# Patient Record
Sex: Male | Born: 1955 | Race: White | Hispanic: No | Smoking: Never smoker
Health system: Southern US, Community
[De-identification: ages and names within clinical notes are randomized; demographics above are authoritative.]

## PROBLEM LIST (undated history)

## (undated) DIAGNOSIS — M1990 Unspecified osteoarthritis, unspecified site: Secondary | ICD-10-CM

## (undated) DIAGNOSIS — I10 Essential (primary) hypertension: Secondary | ICD-10-CM

## (undated) DIAGNOSIS — F419 Anxiety disorder, unspecified: Secondary | ICD-10-CM

## (undated) DIAGNOSIS — G4730 Sleep apnea, unspecified: Secondary | ICD-10-CM

## (undated) DIAGNOSIS — Z8709 Personal history of other diseases of the respiratory system: Secondary | ICD-10-CM

## (undated) DIAGNOSIS — M109 Gout, unspecified: Secondary | ICD-10-CM

## (undated) DIAGNOSIS — F988 Other specified behavioral and emotional disorders with onset usually occurring in childhood and adolescence: Secondary | ICD-10-CM

## (undated) HISTORY — PX: FRACTURE SURGERY: SHX138

## (undated) HISTORY — PX: JOINT REPLACEMENT: SHX530

## (undated) HISTORY — DX: Gout, unspecified: M10.9

## (undated) HISTORY — DX: Anxiety disorder, unspecified: F41.9

## (undated) HISTORY — PX: COLONOSCOPY: SHX174

## (undated) HISTORY — PX: KNEE ARTHROSCOPY: SUR90

## (undated) HISTORY — PX: ORIF ANKLE FRACTURE: SUR919

---

## 2004-06-18 ENCOUNTER — Emergency Department (HOSPITAL_COMMUNITY): Admission: EM | Admit: 2004-06-18 | Discharge: 2004-06-18 | Payer: Self-pay | Admitting: Emergency Medicine

## 2005-05-19 ENCOUNTER — Ambulatory Visit (HOSPITAL_BASED_OUTPATIENT_CLINIC_OR_DEPARTMENT_OTHER): Admission: RE | Admit: 2005-05-19 | Discharge: 2005-05-19 | Payer: Self-pay | Admitting: Orthopedic Surgery

## 2005-07-14 ENCOUNTER — Inpatient Hospital Stay (HOSPITAL_COMMUNITY): Admission: EM | Admit: 2005-07-14 | Discharge: 2005-07-15 | Payer: Self-pay | Admitting: Emergency Medicine

## 2006-12-26 IMAGING — US US RENAL
1 series · 14 of 21 positions shown · non-contrast
Comparison: none

CLINICAL DATA: Hypotension.  No previous surgery. 
 RENAL/URINARY TRACT ULTRASOUND:
TECHNIQUE: Complete ultrasound examination of the urinary tract was performed including evaluation of the kidneys, renal collecting systems, and urinary bladder.

[Series 1: unknown · 0.35mm/px · 14 of 21 slices shown]
[im 1/21]
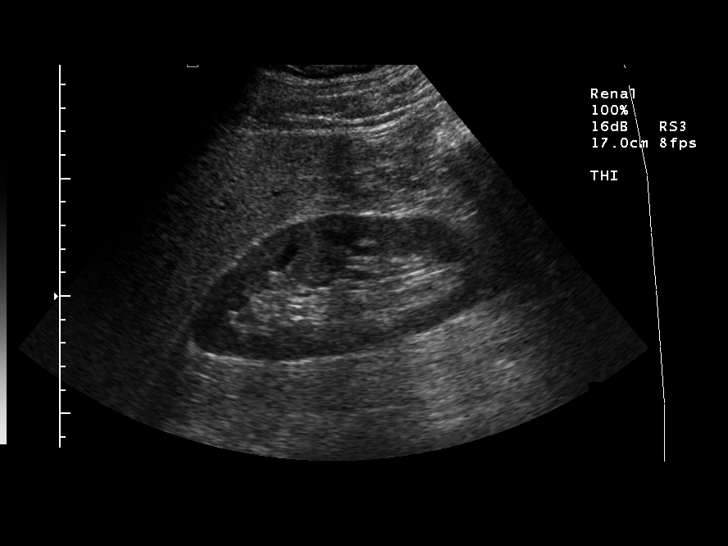
[im 3/21]
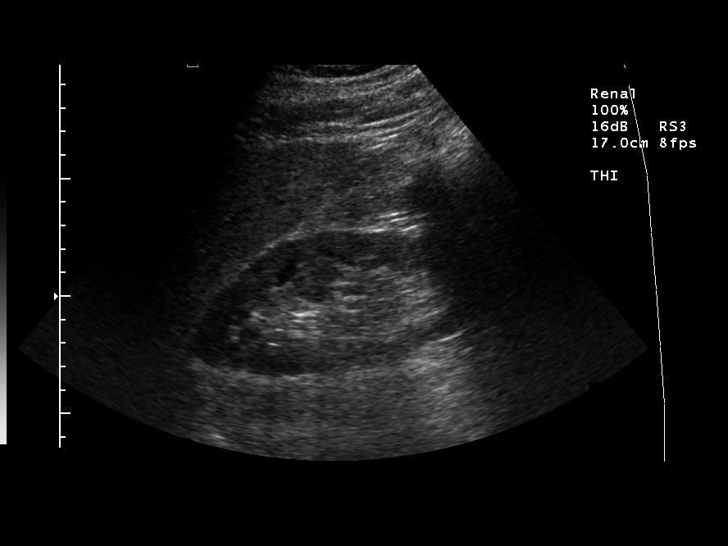
[im 4/21]
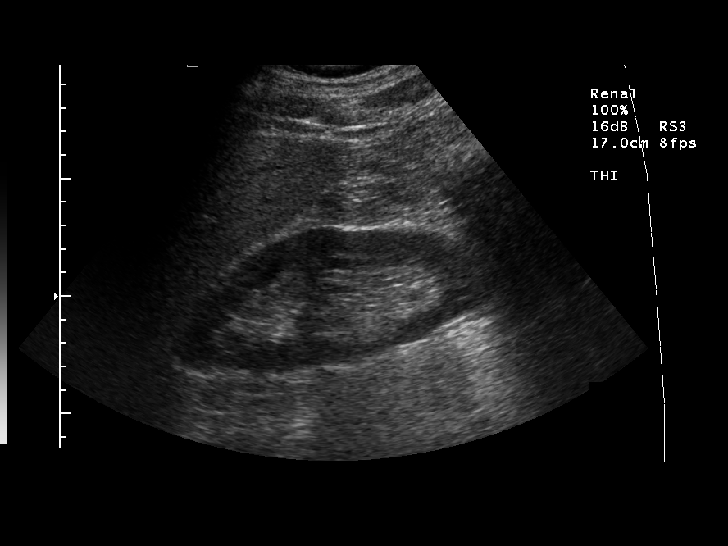
[im 6/21]
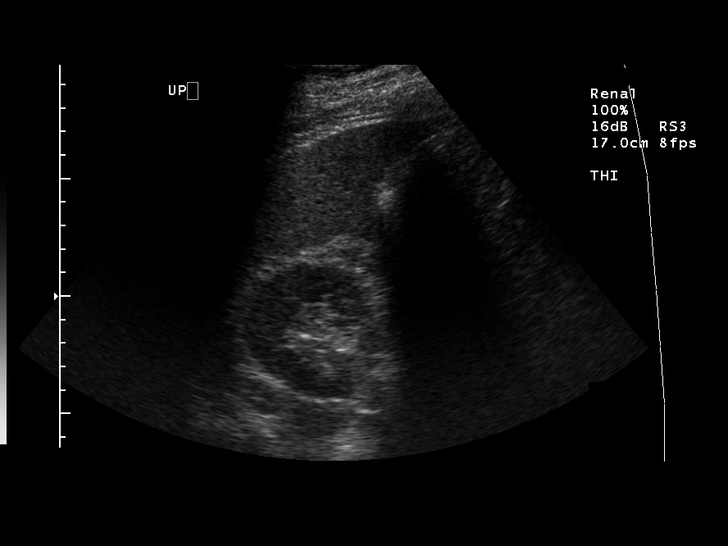
[im 7/21]
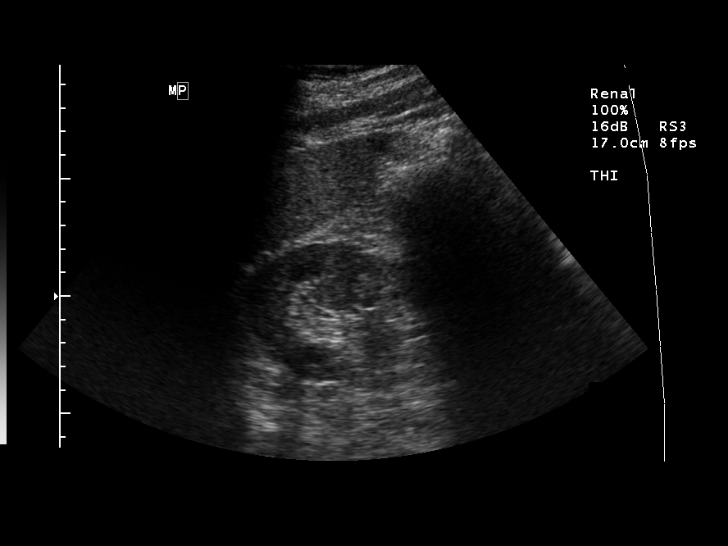
[im 9/21]
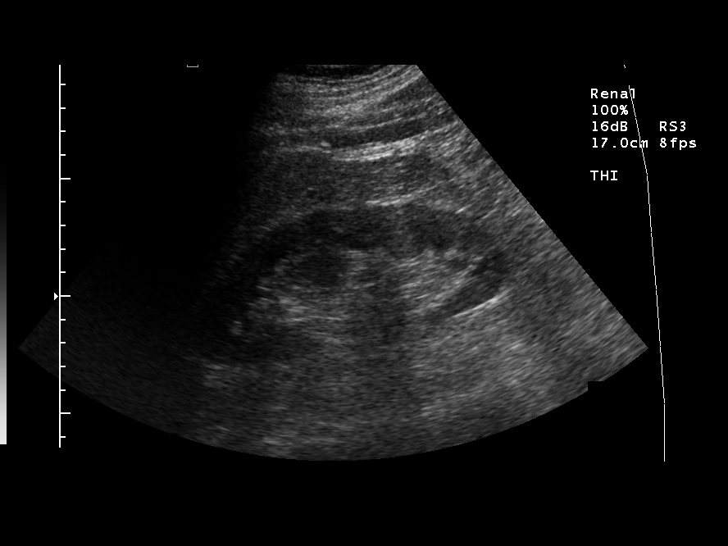
[im 10/21]
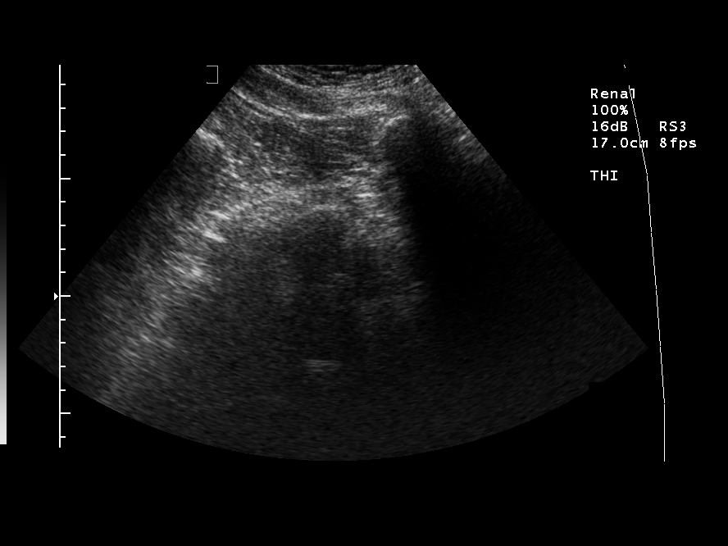
[im 12/21]
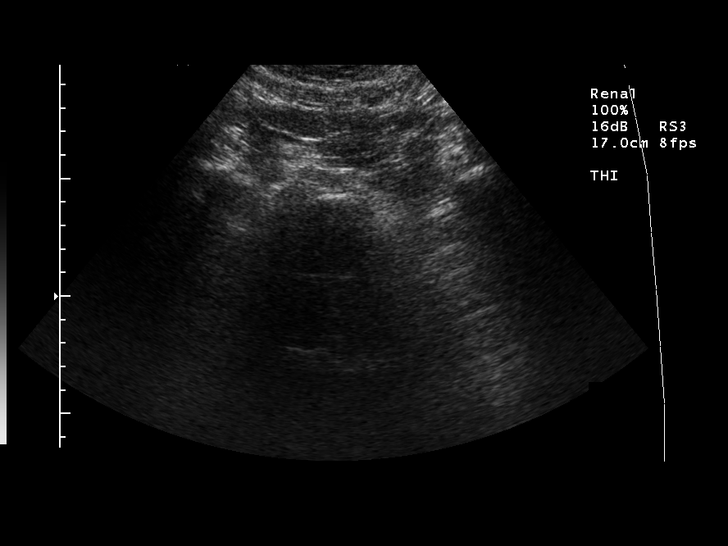
[im 13/21]
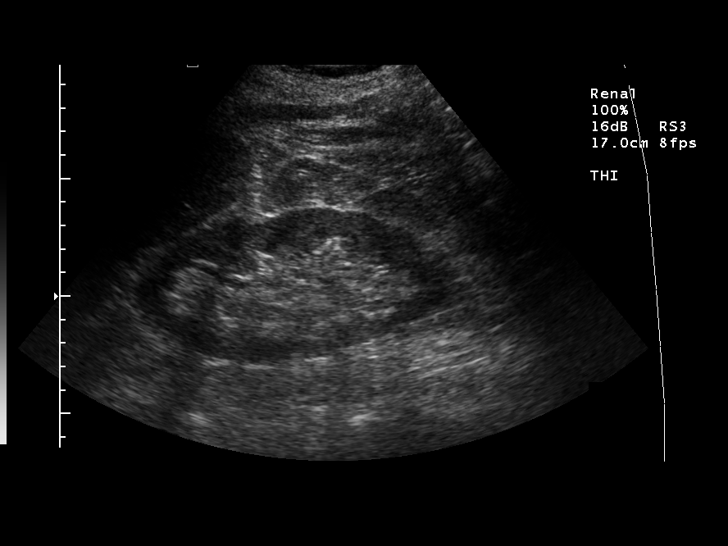
[im 15/21]
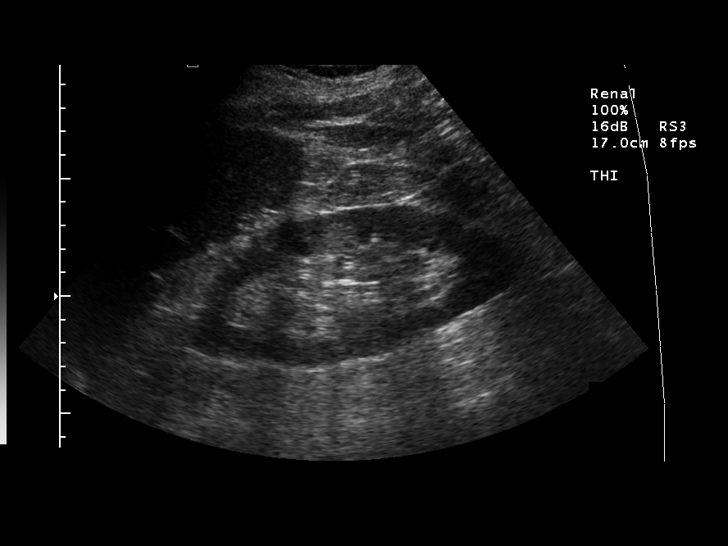
[im 16/21]
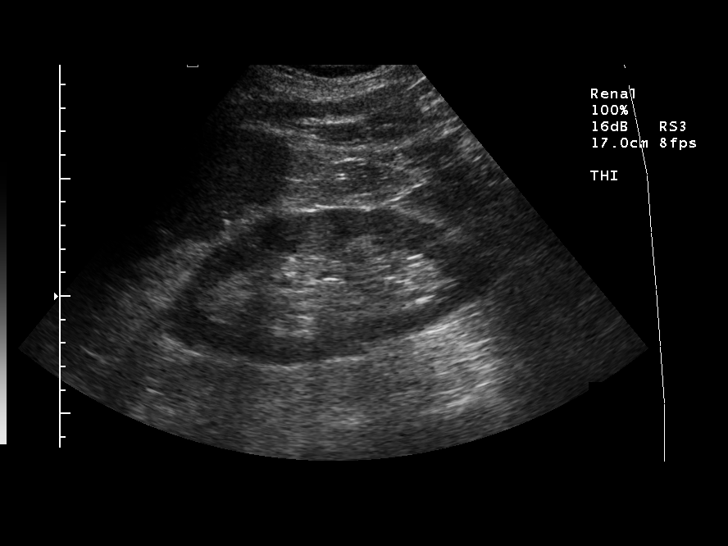
[im 18/21]
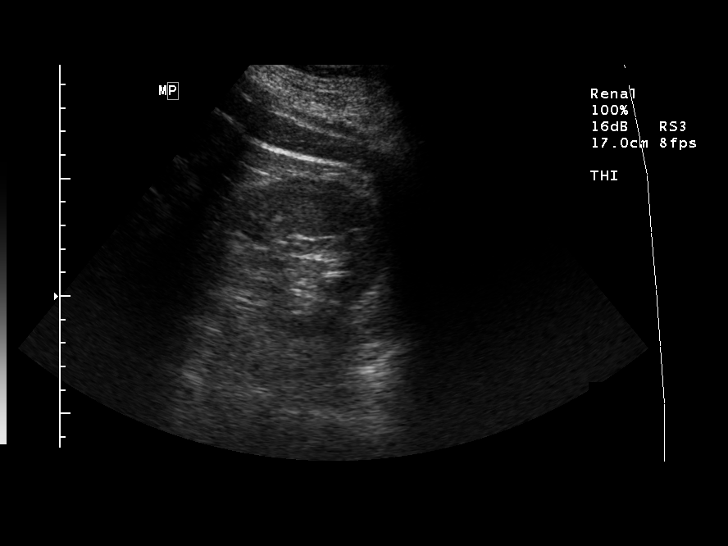
[im 19/21]
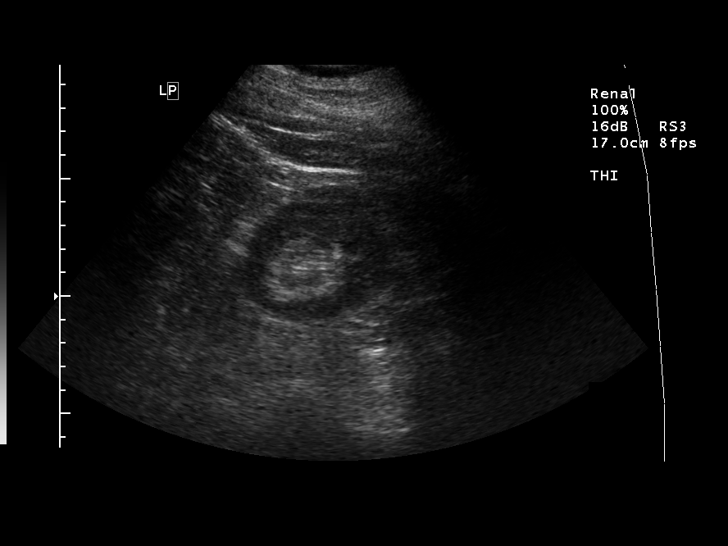
[im 21/21]
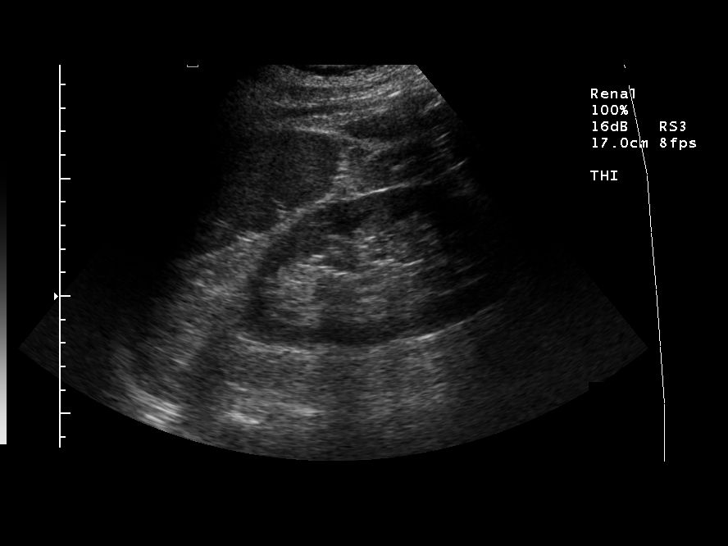

[14 of 21 positions shown; findings below may reference images not displayed]

FINDINGS: Multiple scans of the right kidney show the right kidney to measure 14.2 cm in length with no evidence of hydronephrosis, mass, or calcification. 
 The left kidney measures 13.7 cm and shows no evidence of hydronephrosis, mass, or calcification. 
 There is a Foley catheter in the bladder.
IMPRESSION: Normal right and left kidneys.  Foley catheter in the bladder.

## 2016-02-20 ENCOUNTER — Other Ambulatory Visit: Payer: Self-pay | Admitting: Orthopaedic Surgery

## 2016-03-02 ENCOUNTER — Inpatient Hospital Stay (HOSPITAL_COMMUNITY): Admission: RE | Admit: 2016-03-02 | Payer: Self-pay | Source: Ambulatory Visit

## 2016-03-10 ENCOUNTER — Encounter (HOSPITAL_COMMUNITY): Admission: RE | Payer: Self-pay | Source: Ambulatory Visit

## 2016-03-10 ENCOUNTER — Inpatient Hospital Stay (HOSPITAL_COMMUNITY): Admission: RE | Admit: 2016-03-10 | Payer: Self-pay | Source: Ambulatory Visit | Admitting: Orthopaedic Surgery

## 2016-03-10 SURGERY — ARTHROPLASTY, KNEE, TOTAL
Anesthesia: Spinal | Laterality: Right

## 2016-04-23 ENCOUNTER — Encounter: Payer: Self-pay | Admitting: Family Medicine

## 2016-04-28 ENCOUNTER — Other Ambulatory Visit: Payer: Self-pay | Admitting: Orthopaedic Surgery

## 2016-05-08 NOTE — Pre-Procedure Instructions (Signed)
    Jeremy Bennett  05/08/2016      [id number] [place], [place] [address] [place] [address] Phone: [phone] Fax: [phone]    Your procedure is scheduled on Tuesday, April 24th   Report to [facility] Admitting at 5:30 AM             (posted surgery time is 7:30 am - 9:47 am) .  Call this number if you have questions regarding surgery:  [phone], Mon - Fri from 8-4:30 pm. If unable to come, call [phone] that morning.     Remember:  Do not eat food or drink liquids after midnight Monday.              Take these medicines the MORNING of surgery : NOTHING               4-5 days prior to surgery, STOP taking any VITAMINS, HERBAL SUPPLEMENTS, ANTI-INFLAMMATORIES.   Do not wear jewelry - NO rings or watches.  Do not wear lotions, colognes or deoderant.             Men may shave face and neck.   Do not bring valuables to the hospital.  [facility] is not responsible for any belongings or valuables.  Contacts, dentures or bridgework may not be worn into surgery.  Leave your suitcase in the car.  After surgery it may be brought to your room.  For patients admitted to the hospital, discharge time will be determined by your treatment team.  Please read over the following fact sheets that you were given. Pain Booklet, MRSA Information and Surgical Site Infection Prevention

## 2016-05-11 ENCOUNTER — Other Ambulatory Visit: Payer: Self-pay

## 2016-05-11 ENCOUNTER — Encounter (HOSPITAL_COMMUNITY)
Admission: RE | Admit: 2016-05-11 | Discharge: 2016-05-11 | Disposition: A | Discharge status: Home / Self Care | Payer: 59 | Source: Ambulatory Visit | Attending: Orthopaedic Surgery | Admitting: Orthopaedic Surgery

## 2016-05-11 ENCOUNTER — Encounter (HOSPITAL_COMMUNITY): Payer: Self-pay

## 2016-05-11 ENCOUNTER — Encounter (HOSPITAL_COMMUNITY): Admission: RE | Admit: 2016-05-11 | Payer: 59 | Source: Ambulatory Visit

## 2016-05-11 DIAGNOSIS — Z01812 Encounter for preprocedural laboratory examination: Secondary | ICD-10-CM | POA: Insufficient documentation

## 2016-05-11 DIAGNOSIS — Z01810 Encounter for preprocedural cardiovascular examination: Secondary | ICD-10-CM | POA: Diagnosis not present

## 2016-05-11 DIAGNOSIS — Z01818 Encounter for other preprocedural examination: Secondary | ICD-10-CM

## 2016-05-11 HISTORY — DX: Sleep apnea, unspecified: G47.30

## 2016-05-11 HISTORY — DX: Essential (primary) hypertension: I10

## 2016-05-11 HISTORY — DX: Gout, unspecified: M10.9

## 2016-05-11 HISTORY — DX: Unspecified osteoarthritis, unspecified site: M19.90

## 2016-05-11 HISTORY — DX: Other specified behavioral and emotional disorders with onset usually occurring in childhood and adolescence: F98.8

## 2016-05-11 LAB — CBC WITH DIFFERENTIAL/PLATELET
BASOS PCT: 0 %
Basophils Absolute: 0 10*3/uL (ref 0.0–0.1)
EOS PCT: 3 %
Eosinophils Absolute: 0.2 10*3/uL (ref 0.0–0.7)
HCT: 45.9 % (ref 39.0–52.0)
Hemoglobin: 16.1 g/dL (ref 13.0–17.0)
LYMPHS ABS: 2.1 10*3/uL (ref 0.7–4.0)
Lymphocytes Relative: 32 %
MCH: 32.1 pg (ref 26.0–34.0)
MCHC: 35.1 g/dL (ref 30.0–36.0)
MCV: 91.6 fL (ref 78.0–100.0)
MONOS PCT: 9 %
Monocytes Absolute: 0.6 10*3/uL (ref 0.1–1.0)
Neutro Abs: 3.7 10*3/uL (ref 1.7–7.7)
Neutrophils Relative %: 56 %
PLATELETS: 148 10*3/uL — AB (ref 150–400)
RBC: 5.01 MIL/uL (ref 4.22–5.81)
RDW: 12.5 % (ref 11.5–15.5)
WBC: 6.6 10*3/uL (ref 4.0–10.5)

## 2016-05-11 LAB — BASIC METABOLIC PANEL
Anion gap: 9 (ref 5–15)
BUN: 12 mg/dL (ref 6–20)
CALCIUM: 9.2 mg/dL (ref 8.9–10.3)
CHLORIDE: 104 mmol/L (ref 101–111)
CO2: 27 mmol/L (ref 22–32)
Creatinine, Ser: 0.86 mg/dL (ref 0.61–1.24)
GFR calc non Af Amer: >60 mL/min (ref >60)
Glucose, Bld: 90 mg/dL (ref 65–99)
Potassium: 4 mmol/L (ref 3.5–5.1)
Sodium: 140 mmol/L (ref 135–145)

## 2016-05-11 LAB — SURGICAL PCR SCREEN
MRSA, PCR: NEGATIVE
Staphylococcus aureus: POSITIVE — AB

## 2016-05-11 LAB — APTT: aPTT: 39 seconds — ABNORMAL HIGH (ref 24–36)

## 2016-05-11 LAB — URINALYSIS, ROUTINE W REFLEX MICROSCOPIC
Bilirubin Urine: NEGATIVE
Glucose, UA: NEGATIVE mg/dL
Hgb urine dipstick: NEGATIVE
KETONES UR: NEGATIVE mg/dL
LEUKOCYTES UA: NEGATIVE
NITRITE: NEGATIVE
PROTEIN: NEGATIVE mg/dL
Specific Gravity, Urine: 1.019 (ref 1.005–1.030)
pH: 7 (ref 5.0–8.0)

## 2016-05-11 LAB — TYPE AND SCREEN
ABO/RH(D): O POS
Antibody Screen: NEGATIVE

## 2016-05-11 LAB — ABO/RH: ABO/RH(D): O POS

## 2016-05-11 NOTE — Progress Notes (Signed)
I called a prescription [facility] [place], [place] 

## 2016-05-18 MED ORDER — TRANEXAMIC ACID 1000 MG/10ML IV SOLN
2000.0000 mg | INTRAVENOUS | Status: AC
  Administered 2016-05-19: 2000 mg via TOPICAL
  Filled 2016-05-18: qty 20

## 2016-05-18 MED ORDER — DEXTROSE 5 % IV SOLN
3.0000 g | INTRAVENOUS | Status: AC
  Administered 2016-05-19: 3 g via INTRAVENOUS
  Filled 2016-05-18: qty 3000

## 2016-05-18 MED ORDER — LACTATED RINGERS IV SOLN
INTRAVENOUS | Status: DC
  Administered 2016-05-19 (×2): via INTRAVENOUS

## 2016-05-18 NOTE — H&P (Signed)
TOTAL KNEE ADMISSION H&P  Patient is being admitted for right total knee arthroplasty.  Subjective:  Chief Complaint:right knee pain.  HPI: Jeremy Bennett, 61 y.o. male, has a history of pain and functional disability in the right knee due to arthritis and has failed non-surgical conservative treatments for greater than 12 weeks to includeNSAID's and/or analgesics, corticosteriod injections, flexibility and strengthening excercises, use of assistive devices, weight reduction as appropriate and activity modification.  Onset of symptoms was gradual, starting 5 years ago with gradually worsening course since that time. The patient noted prior procedures on the knee to include  arthroscopy on the right knee(s).  Patient currently rates pain in the right knee(s) at 10 out of 10 with activity. Patient has night pain, worsening of pain with activity and weight bearing, pain that interferes with activities of daily living, crepitus and joint swelling.  Patient has evidence of subchondral cysts, subchondral sclerosis, periarticular osteophytes and joint space narrowing by imaging studies. There is no active infection.  There are no active problems to display for this patient.  Past Medical History:  Diagnosis Date  . ADD (attention deficit disorder)   . Arthritis   . Gout   . Hypertension   . Sleep apnea    tested more than 5 yrs ago    Past Surgical History:  Procedure Laterality Date  . FRACTURE SURGERY     right clavicle   1972  . KNEE ARTHROSCOPY     right    No prescriptions prior to admission.   Allergies  Allergen Reactions  . No Known Allergies     Social History  Substance Use Topics  . Smoking status: Never Smoker  . Smokeless tobacco: Never Used  . Alcohol use 7.2 oz/week    12 Cans of beer per week     Comment: in a week's time    No family history on file.   Review of Systems  Musculoskeletal: Positive for joint pain.       Right knee  All other systems reviewed and  are negative.   Objective:  Physical Exam  Constitutional: He is oriented to person, place, and time. He appears well-developed and well-nourished.  HENT:  Head: Normocephalic and atraumatic.  Eyes: Pupils are equal, round, and reactive to light.  Neck: Normal range of motion.  Cardiovascular: Normal rate and regular rhythm.   Respiratory: Effort normal.  GI: Soft.  Musculoskeletal:  Right knee motion is 0-100. He has pain on the medial joint line and some crepitation. Opposite knee has slightly greater motion with some pain as well. Hip motion is full on both sides. Straight leg raise is negative. Sensation and motor function are intact in his feet with palpable pulses on both sides.  Neurological: He is alert and oriented to person, place, and time.  Skin: Skin is warm and dry.  Psychiatric: He has a normal mood and affect. His behavior is normal. Judgment and thought content normal.    Vital signs in last 24 hours:    Labs:   Estimated body mass index is 41.21 kg/m as calculated from the following:   Height as of 05/11/16: 6\' 2"  (1.88 m).   Weight as of 05/11/16: 145.6 kg (321 lb).   Imaging Review Plain radiographs demonstrate severe degenerative joint disease of the right knee(s). The overall alignment isneutral. The bone quality appears to be good for age and reported activity level.  Assessment/Plan:  End stage primary arthritis, right knee   The patient  history, physical examination, clinical judgment of the provider and imaging studies are consistent with end stage degenerative joint disease of the right knee(s) and total knee arthroplasty is deemed medically necessary. The treatment options including medical management, injection therapy arthroscopy and arthroplasty were discussed at length. The risks and benefits of total knee arthroplasty were presented and reviewed. The risks due to aseptic loosening, infection, stiffness, patella tracking problems, thromboembolic  complications and other imponderables were discussed. The patient acknowledged the explanation, agreed to proceed with the plan and consent was signed. Patient is being admitted for inpatient treatment for surgery, pain control, PT, OT, prophylactic antibiotics, VTE prophylaxis, progressive ambulation and ADL's and discharge planning. The patient is planning to be discharged home with home health services

## 2016-05-18 NOTE — Anesthesia Preprocedure Evaluation (Addendum)
Anesthesia Evaluation  Patient identified by MRN, date of birth, ID band Patient awake    Reviewed: Allergy & Precautions, H&P , NPO status , Patient's Chart, lab work & pertinent test results  Airway Mallampati: III  TM Distance: >3 FB Neck ROM: Full    Dental no notable dental hx. (+) Teeth Intact, Dental Advisory Given   Pulmonary sleep apnea and Continuous Positive Airway Pressure Ventilation ,    Pulmonary exam normal breath sounds clear to auscultation       Cardiovascular Exercise Tolerance: Good hypertension, Pt. on medications  Rhythm:Regular Rate:Normal     Neuro/Psych negative neurological ROS  negative psych ROS   GI/Hepatic negative GI ROS, Neg liver ROS,   Endo/Other  Morbid obesity  Renal/GU negative Renal ROS  negative genitourinary   Musculoskeletal  (+) Arthritis , Osteoarthritis,    Abdominal   Peds  Hematology negative hematology ROS (+)   Anesthesia Other Findings   Reproductive/Obstetrics negative OB ROS                            Anesthesia Physical Anesthesia Plan  ASA: III  Anesthesia Plan: Spinal   Post-op Pain Management:  Regional for Post-op pain   Induction: Intravenous  Airway Management Planned: Simple Face Mask  Additional Equipment:   Intra-op Plan:   Post-operative Plan:   Informed Consent: I have reviewed the patients History and Physical, chart, labs and discussed the procedure including the risks, benefits and alternatives for the proposed anesthesia with the patient or authorized representative who has indicated his/her understanding and acceptance.   Dental advisory given  Plan Discussed with: CRNA  Anesthesia Plan Comments:         Anesthesia Quick Evaluation

## 2016-05-19 ENCOUNTER — Encounter (HOSPITAL_COMMUNITY)
Admission: RE | Disposition: A | Discharge status: Home Health | Payer: Self-pay | Source: Ambulatory Visit | Attending: Orthopaedic Surgery

## 2016-05-19 ENCOUNTER — Inpatient Hospital Stay (HOSPITAL_COMMUNITY): Payer: 59 | Admitting: Anesthesiology

## 2016-05-19 ENCOUNTER — Inpatient Hospital Stay (HOSPITAL_COMMUNITY)
Admission: RE | Admit: 2016-05-19 | Discharge: 2016-05-21 | DRG: 470 | Disposition: A | Discharge status: Home Health | Payer: 59 | Source: Ambulatory Visit | Attending: Orthopaedic Surgery | Admitting: Orthopaedic Surgery

## 2016-05-19 ENCOUNTER — Inpatient Hospital Stay (HOSPITAL_COMMUNITY): Payer: 59

## 2016-05-19 ENCOUNTER — Encounter (HOSPITAL_COMMUNITY): Payer: Self-pay | Admitting: *Deleted

## 2016-05-19 DIAGNOSIS — Z01818 Encounter for other preprocedural examination: Secondary | ICD-10-CM

## 2016-05-19 DIAGNOSIS — G4730 Sleep apnea, unspecified: Secondary | ICD-10-CM | POA: Diagnosis present

## 2016-05-19 DIAGNOSIS — F988 Other specified behavioral and emotional disorders with onset usually occurring in childhood and adolescence: Secondary | ICD-10-CM | POA: Diagnosis present

## 2016-05-19 DIAGNOSIS — M25561 Pain in right knee: Secondary | ICD-10-CM | POA: Diagnosis present

## 2016-05-19 DIAGNOSIS — M109 Gout, unspecified: Secondary | ICD-10-CM | POA: Diagnosis present

## 2016-05-19 DIAGNOSIS — Z6841 Body Mass Index (BMI) 40.0 and over, adult: Secondary | ICD-10-CM | POA: Diagnosis not present

## 2016-05-19 DIAGNOSIS — I10 Essential (primary) hypertension: Secondary | ICD-10-CM | POA: Diagnosis present

## 2016-05-19 DIAGNOSIS — M1711 Unilateral primary osteoarthritis, right knee: Secondary | ICD-10-CM | POA: Diagnosis present

## 2016-05-19 HISTORY — DX: Personal history of other diseases of the respiratory system: Z87.09

## 2016-05-19 HISTORY — PX: TOTAL KNEE ARTHROPLASTY: SHX125

## 2016-05-19 SURGERY — ARTHROPLASTY, KNEE, TOTAL
Anesthesia: Spinal | Site: Knee | Laterality: Right

## 2016-05-19 MED ORDER — LACTATED RINGERS IV SOLN
INTRAVENOUS | Status: DC
  Administered 2016-05-19: 16:00:00 via INTRAVENOUS

## 2016-05-19 MED ORDER — PHENYLEPHRINE 40 MCG/ML (10ML) SYRINGE FOR IV PUSH (FOR BLOOD PRESSURE SUPPORT)
PREFILLED_SYRINGE | INTRAVENOUS | Status: AC
  Filled 2016-05-19: qty 10

## 2016-05-19 MED ORDER — PROPOFOL 10 MG/ML IV BOLUS
INTRAVENOUS | Status: DC | PRN
  Administered 2016-05-19 (×2): 30 mg via INTRAVENOUS

## 2016-05-19 MED ORDER — BUPIVACAINE LIPOSOME 1.3 % IJ SUSP
20.0000 mL | INTRAMUSCULAR | Status: AC
  Administered 2016-05-19: 20 mL
  Filled 2016-05-19: qty 20

## 2016-05-19 MED ORDER — BUPIVACAINE HCL (PF) 0.25 % IJ SOLN
INTRAMUSCULAR | Status: AC
  Filled 2016-05-19: qty 30

## 2016-05-19 MED ORDER — BUPIVACAINE IN DEXTROSE 0.75-8.25 % IT SOLN
INTRATHECAL | Status: DC | PRN
  Administered 2016-05-19: 15 mg via INTRATHECAL

## 2016-05-19 MED ORDER — 0.9 % SODIUM CHLORIDE (POUR BTL) OPTIME
TOPICAL | Status: DC | PRN
  Administered 2016-05-19: 1000 mL

## 2016-05-19 MED ORDER — ACETAMINOPHEN 650 MG RE SUPP: 650.0000 mg | Freq: Four times a day (QID) | RECTAL | Status: DC | PRN

## 2016-05-19 MED ORDER — ALBUMIN HUMAN 5 % IV SOLN
INTRAVENOUS | Status: DC | PRN
  Administered 2016-05-19: 08:00:00 via INTRAVENOUS

## 2016-05-19 MED ORDER — PHENOL 1.4 % MT LIQD
1.0000 | OROMUCOSAL | Status: DC | PRN
Start: 2016-05-19 — End: 2016-05-21

## 2016-05-19 MED ORDER — MIDAZOLAM HCL 2 MG/2ML IJ SOLN
INTRAMUSCULAR | Status: AC
Start: 2016-05-19 — End: 2016-05-19
  Filled 2016-05-19: qty 2

## 2016-05-19 MED ORDER — ROPIVACAINE HCL 5 MG/ML IJ SOLN
INTRAMUSCULAR | Status: DC | PRN
  Administered 2016-05-19: 30 mL via PERINEURAL

## 2016-05-19 MED ORDER — METOCLOPRAMIDE HCL 5 MG PO TABS: 5.0000 mg | ORAL_TABLET | Freq: Three times a day (TID) | ORAL | Status: DC | PRN

## 2016-05-19 MED ORDER — METHOCARBAMOL 500 MG PO TABS
ORAL_TABLET | ORAL | Status: AC
  Filled 2016-05-19: qty 1

## 2016-05-19 MED ORDER — PAROXETINE HCL 20 MG PO TABS
20.0000 mg | ORAL_TABLET | Freq: Every morning | ORAL | Status: DC
  Administered 2016-05-20 – 2016-05-21 (×3): 20 mg via ORAL
  Filled 2016-05-19 (×2): qty 1

## 2016-05-19 MED ORDER — FENTANYL CITRATE (PF) 250 MCG/5ML IJ SOLN
INTRAMUSCULAR | Status: DC | PRN
  Administered 2016-05-19: 50 ug via INTRAVENOUS
  Administered 2016-05-19: 25 ug via INTRAVENOUS
  Administered 2016-05-19 (×3): 50 ug via INTRAVENOUS
  Administered 2016-05-19: 25 ug via INTRAVENOUS

## 2016-05-19 MED ORDER — DOCUSATE SODIUM 100 MG PO CAPS
100.0000 mg | ORAL_CAPSULE | Freq: Two times a day (BID) | ORAL | Status: DC
  Administered 2016-05-19 – 2016-05-21 (×4): 100 mg via ORAL
  Filled 2016-05-19 (×4): qty 1

## 2016-05-19 MED ORDER — BISACODYL 5 MG PO TBEC
5.0000 mg | DELAYED_RELEASE_TABLET | Freq: Every day | ORAL | Status: DC | PRN
  Administered 2016-05-20: 5 mg via ORAL
  Filled 2016-05-19: qty 1

## 2016-05-19 MED ORDER — LIDOCAINE 2% (20 MG/ML) 5 ML SYRINGE
INTRAMUSCULAR | Status: AC
  Filled 2016-05-19: qty 5

## 2016-05-19 MED ORDER — PROPOFOL 10 MG/ML IV BOLUS
INTRAVENOUS | Status: AC
  Filled 2016-05-19: qty 20

## 2016-05-19 MED ORDER — ASPIRIN EC 325 MG PO TBEC
325.0000 mg | DELAYED_RELEASE_TABLET | Freq: Two times a day (BID) | ORAL | Status: DC
  Administered 2016-05-20 – 2016-05-21 (×3): 325 mg via ORAL
  Filled 2016-05-19 (×3): qty 1

## 2016-05-19 MED ORDER — METHOCARBAMOL 1000 MG/10ML IJ SOLN
500.0000 mg | Freq: Four times a day (QID) | INTRAVENOUS | Status: DC | PRN
  Filled 2016-05-19: qty 5

## 2016-05-19 MED ORDER — ONDANSETRON HCL 4 MG/2ML IJ SOLN
INTRAMUSCULAR | Status: DC | PRN
  Administered 2016-05-19: 4 mg via INTRAVENOUS

## 2016-05-19 MED ORDER — ONDANSETRON HCL 4 MG/2ML IJ SOLN: 4.0000 mg | Freq: Four times a day (QID) | INTRAMUSCULAR | Status: DC | PRN

## 2016-05-19 MED ORDER — HYDROMORPHONE HCL 1 MG/ML IJ SOLN
0.2500 mg | INTRAMUSCULAR | Status: DC | PRN
  Administered 2016-05-19 (×3): 0.5 mg via INTRAVENOUS

## 2016-05-19 MED ORDER — HYDROCHLOROTHIAZIDE 12.5 MG PO CAPS
12.5000 mg | ORAL_CAPSULE | Freq: Every day | ORAL | Status: DC
  Administered 2016-05-19 – 2016-05-21 (×3): 12.5 mg via ORAL
  Filled 2016-05-19 (×3): qty 1

## 2016-05-19 MED ORDER — EPINEPHRINE PF 1 MG/ML IJ SOLN
INTRAMUSCULAR | Status: AC
  Filled 2016-05-19: qty 1

## 2016-05-19 MED ORDER — SODIUM CHLORIDE 0.9 % IR SOLN
Status: DC | PRN
  Administered 2016-05-19: 3000 mL

## 2016-05-19 MED ORDER — TRANEXAMIC ACID 1000 MG/10ML IV SOLN
1000.0000 mg | INTRAVENOUS | Status: AC
  Administered 2016-05-19: 1000 mg via INTRAVENOUS
  Filled 2016-05-19: qty 10

## 2016-05-19 MED ORDER — FENTANYL CITRATE (PF) 250 MCG/5ML IJ SOLN
INTRAMUSCULAR | Status: AC
  Filled 2016-05-19: qty 5

## 2016-05-19 MED ORDER — ONDANSETRON HCL 4 MG PO TABS: 4.0000 mg | ORAL_TABLET | Freq: Four times a day (QID) | ORAL | Status: DC | PRN

## 2016-05-19 MED ORDER — ACETAMINOPHEN 325 MG PO TABS
650.0000 mg | ORAL_TABLET | Freq: Four times a day (QID) | ORAL | Status: DC | PRN
Start: 2016-05-19 — End: 2016-05-21

## 2016-05-19 MED ORDER — CELECOXIB 200 MG PO CAPS
200.0000 mg | ORAL_CAPSULE | Freq: Once | ORAL | Status: AC
  Administered 2016-05-19: 200 mg via ORAL
  Filled 2016-05-19 (×2): qty 1

## 2016-05-19 MED ORDER — BUPIVACAINE-EPINEPHRINE (PF) 0.25% -1:200000 IJ SOLN
INTRAMUSCULAR | Status: DC | PRN
  Administered 2016-05-19: 20 mL

## 2016-05-19 MED ORDER — METOPROLOL TARTRATE 5 MG/5ML IV SOLN
INTRAVENOUS | Status: AC
  Filled 2016-05-19: qty 5

## 2016-05-19 MED ORDER — CEFAZOLIN SODIUM-DEXTROSE 2-4 GM/100ML-% IV SOLN
2.0000 g | Freq: Four times a day (QID) | INTRAVENOUS | Status: AC
  Administered 2016-05-19 (×2): 2 g via INTRAVENOUS
  Filled 2016-05-19 (×4): qty 100

## 2016-05-19 MED ORDER — HYDROMORPHONE HCL 1 MG/ML IJ SOLN
0.5000 mg | INTRAMUSCULAR | Status: DC | PRN
  Administered 2016-05-19 – 2016-05-21 (×8): 1 mg via INTRAVENOUS
  Filled 2016-05-19 (×8): qty 1

## 2016-05-19 MED ORDER — KETAMINE HCL 10 MG/ML IJ SOLN
INTRAMUSCULAR | Status: AC
  Filled 2016-05-19: qty 1

## 2016-05-19 MED ORDER — HYDROMORPHONE HCL 1 MG/ML IJ SOLN
INTRAMUSCULAR | Status: AC
  Administered 2016-05-19: 0.5 mg via INTRAVENOUS
  Filled 2016-05-19: qty 1

## 2016-05-19 MED ORDER — HYDROMORPHONE HCL 1 MG/ML IJ SOLN
INTRAMUSCULAR | Status: AC
  Administered 2016-05-19: 0.5 mg via INTRAVENOUS
  Filled 2016-05-19: qty 0.5

## 2016-05-19 MED ORDER — GABAPENTIN 300 MG PO CAPS
600.0000 mg | ORAL_CAPSULE | Freq: Once | ORAL | Status: AC
  Administered 2016-05-19: 600 mg via ORAL
  Filled 2016-05-19 (×2): qty 2

## 2016-05-19 MED ORDER — KETAMINE HCL 10 MG/ML IJ SOLN
INTRAMUSCULAR | Status: DC | PRN
  Administered 2016-05-19 (×2): 20 mg via INTRAVENOUS

## 2016-05-19 MED ORDER — MELATONIN 5 MG PO CAPS: 5.0000 mg | ORAL_CAPSULE | Freq: Every evening | ORAL | Status: DC | PRN

## 2016-05-19 MED ORDER — PHENYLEPHRINE HCL 10 MG/ML IJ SOLN
INTRAMUSCULAR | Status: DC | PRN
  Administered 2016-05-19: 120 ug via INTRAVENOUS
  Administered 2016-05-19: 160 ug via INTRAVENOUS
  Administered 2016-05-19: 120 ug via INTRAVENOUS
  Administered 2016-05-19: 80 ug via INTRAVENOUS

## 2016-05-19 MED ORDER — ACETAMINOPHEN 500 MG PO TABS
1000.0000 mg | ORAL_TABLET | Freq: Once | ORAL | Status: AC
  Administered 2016-05-19: 1000 mg via ORAL
  Filled 2016-05-19 (×2): qty 2

## 2016-05-19 MED ORDER — LIDOCAINE HCL (CARDIAC) 20 MG/ML IV SOLN
INTRAVENOUS | Status: DC | PRN
  Administered 2016-05-19: 60 mg via INTRATRACHEAL

## 2016-05-19 MED ORDER — CHLORHEXIDINE GLUCONATE 4 % EX LIQD: 60.0000 mL | Freq: Once | CUTANEOUS | Status: DC

## 2016-05-19 MED ORDER — PROPOFOL 1000 MG/100ML IV EMUL
INTRAVENOUS | Status: AC
  Filled 2016-05-19: qty 100

## 2016-05-19 MED ORDER — HYDROCODONE-ACETAMINOPHEN 5-325 MG PO TABS
1.0000 | ORAL_TABLET | ORAL | Status: DC | PRN
  Administered 2016-05-19 – 2016-05-21 (×8): 2 via ORAL
  Filled 2016-05-19 (×8): qty 2

## 2016-05-19 MED ORDER — DIPHENHYDRAMINE HCL 12.5 MG/5ML PO ELIX: 12.5000 mg | ORAL_SOLUTION | ORAL | Status: DC | PRN

## 2016-05-19 MED ORDER — MIDAZOLAM HCL 2 MG/2ML IJ SOLN
INTRAMUSCULAR | Status: DC | PRN
  Administered 2016-05-19 (×2): 1 mg via INTRAVENOUS

## 2016-05-19 MED ORDER — ONDANSETRON HCL 4 MG/2ML IJ SOLN
INTRAMUSCULAR | Status: AC
  Filled 2016-05-19: qty 2

## 2016-05-19 MED ORDER — ALUM & MAG HYDROXIDE-SIMETH 200-200-20 MG/5ML PO SUSP: 30.0000 mL | ORAL | Status: DC | PRN

## 2016-05-19 MED ORDER — PROPOFOL 500 MG/50ML IV EMUL
INTRAVENOUS | Status: DC | PRN
  Administered 2016-05-19: 08:00:00 via INTRAVENOUS
  Administered 2016-05-19: 50 ug/kg/min via INTRAVENOUS

## 2016-05-19 MED ORDER — ALBUTEROL SULFATE HFA 108 (90 BASE) MCG/ACT IN AERS
INHALATION_SPRAY | RESPIRATORY_TRACT | Status: AC
  Filled 2016-05-19: qty 6.7

## 2016-05-19 MED ORDER — TRANEXAMIC ACID 1000 MG/10ML IV SOLN
1000.0000 mg | Freq: Once | INTRAVENOUS | Status: DC
  Filled 2016-05-19 (×2): qty 10

## 2016-05-19 MED ORDER — ALBUTEROL SULFATE HFA 108 (90 BASE) MCG/ACT IN AERS
INHALATION_SPRAY | RESPIRATORY_TRACT | Status: DC | PRN
  Administered 2016-05-19: 2 via RESPIRATORY_TRACT

## 2016-05-19 MED ORDER — METHOCARBAMOL 500 MG PO TABS
500.0000 mg | ORAL_TABLET | Freq: Four times a day (QID) | ORAL | Status: DC | PRN
  Administered 2016-05-19 – 2016-05-21 (×7): 500 mg via ORAL
  Filled 2016-05-19 (×6): qty 1

## 2016-05-19 MED ORDER — SODIUM CHLORIDE 0.9 % IJ SOLN
INTRAMUSCULAR | Status: DC | PRN
  Administered 2016-05-19: 40 mL

## 2016-05-19 MED ORDER — LISINOPRIL 20 MG PO TABS
20.0000 mg | ORAL_TABLET | Freq: Every day | ORAL | Status: DC
  Administered 2016-05-19 – 2016-05-21 (×3): 20 mg via ORAL
  Filled 2016-05-19 (×3): qty 1

## 2016-05-19 MED ORDER — METOCLOPRAMIDE HCL 5 MG/ML IJ SOLN: 5.0000 mg | Freq: Three times a day (TID) | INTRAMUSCULAR | Status: DC | PRN

## 2016-05-19 MED ORDER — PHENYLEPHRINE HCL 10 MG/ML IJ SOLN
INTRAMUSCULAR | Status: DC | PRN
  Administered 2016-05-19: 50 ug/min via INTRAVENOUS
  Administered 2016-05-19: 100 ug/min via INTRAVENOUS

## 2016-05-19 MED ORDER — MENTHOL 3 MG MT LOZG
1.0000 | LOZENGE | OROMUCOSAL | Status: DC | PRN
Start: 2016-05-19 — End: 2016-05-21

## 2016-05-19 MED ORDER — METOPROLOL TARTARATE 1 MG/ML SYRINGE (5ML)
Status: DC | PRN
  Administered 2016-05-19 (×5): 1 mg via INTRAVENOUS

## 2016-05-19 MED ORDER — GLYCOPYRROLATE 0.2 MG/ML IJ SOLN
INTRAMUSCULAR | Status: DC | PRN
  Administered 2016-05-19: 0.2 mg via INTRAVENOUS

## 2016-05-19 SURGICAL SUPPLY — 68 items
BAG DECANTER FOR FLEXI CONT (MISCELLANEOUS) ×2 IMPLANT
BANDAGE ACE 4X5 VEL STRL LF (GAUZE/BANDAGES/DRESSINGS) ×1 IMPLANT
BANDAGE ESMARK 6X9 LF (GAUZE/BANDAGES/DRESSINGS) ×1 IMPLANT
BLADE SAGITTAL 25.0X1.19X90 (BLADE) ×1 IMPLANT
BLADE SAGITTAL 25.0X1.19X90MM (BLADE) ×1
BLADE SAW SGTL 13.0X1.19X90.0M (BLADE) IMPLANT
BNDG CMPR 9X6 STRL LF SNTH (GAUZE/BANDAGES/DRESSINGS) ×1
BNDG CMPR MED 10X6 ELC LF (GAUZE/BANDAGES/DRESSINGS) ×1
BNDG ELASTIC 6X10 VLCR STRL LF (GAUZE/BANDAGES/DRESSINGS) ×3 IMPLANT
BNDG ESMARK 6X9 LF (GAUZE/BANDAGES/DRESSINGS) ×3
BNDG GAUZE ELAST 4 BULKY (GAUZE/BANDAGES/DRESSINGS) ×2 IMPLANT
BOWL SMART MIX CTS (DISPOSABLE) ×3 IMPLANT
CAP KNEE TOTAL 3 SIGMA ×2 IMPLANT
CEMENT HV SMART SET (Cement) ×6 IMPLANT
CLOSURE WOUND 1/2 X4 (GAUZE/BANDAGES/DRESSINGS) ×1
COVER SURGICAL LIGHT HANDLE (MISCELLANEOUS) ×3 IMPLANT
CUFF TOURNIQUET SINGLE 34IN LL (TOURNIQUET CUFF) ×3 IMPLANT
CUFF TOURNIQUET SINGLE 44IN (TOURNIQUET CUFF) IMPLANT
DECANTER SPIKE VIAL GLASS SM (MISCELLANEOUS) ×1 IMPLANT
DRAPE EXTREMITY T 121X128X90 (DRAPE) ×3 IMPLANT
DRAPE HALF SHEET 40X57 (DRAPES) ×6 IMPLANT
DRAPE U-SHAPE 47X51 STRL (DRAPES) ×3 IMPLANT
DRSG ADAPTIC 3X8 NADH LF (GAUZE/BANDAGES/DRESSINGS) ×1 IMPLANT
DRSG AQUACEL AG ADV 3.5X10 (GAUZE/BANDAGES/DRESSINGS) ×2 IMPLANT
DRSG PAD ABDOMINAL 8X10 ST (GAUZE/BANDAGES/DRESSINGS) ×5 IMPLANT
DURAPREP 26ML APPLICATOR (WOUND CARE) ×5 IMPLANT
ELECT REM PT RETURN 9FT ADLT (ELECTROSURGICAL) ×3
ELECTRODE REM PT RTRN 9FT ADLT (ELECTROSURGICAL) ×1 IMPLANT
FACESHIELD WRAPAROUND (MASK) ×6 IMPLANT
FACESHIELD WRAPAROUND OR TEAM (MASK) IMPLANT
GAUZE SPONGE 4X4 12PLY STRL (GAUZE/BANDAGES/DRESSINGS) ×1 IMPLANT
GLOVE BIO SURGEON STRL SZ8 (GLOVE) ×6 IMPLANT
GLOVE BIOGEL PI IND STRL 8 (GLOVE) ×2 IMPLANT
GLOVE BIOGEL PI INDICATOR 8 (GLOVE) ×4
GOWN STRL REUS W/ TWL LRG LVL3 (GOWN DISPOSABLE) ×1 IMPLANT
GOWN STRL REUS W/ TWL XL LVL3 (GOWN DISPOSABLE) ×2 IMPLANT
GOWN STRL REUS W/TWL LRG LVL3 (GOWN DISPOSABLE) ×3
GOWN STRL REUS W/TWL XL LVL3 (GOWN DISPOSABLE) ×6
HANDPIECE INTERPULSE COAX TIP (DISPOSABLE) ×3
HOOD PEEL AWAY FACE SHEILD DIS (HOOD) ×6 IMPLANT
IMMOBILIZER KNEE 22 UNIV (SOFTGOODS) ×3 IMPLANT
KIT BASIN OR (CUSTOM PROCEDURE TRAY) ×3 IMPLANT
KIT ROOM TURNOVER OR (KITS) ×3 IMPLANT
MANIFOLD NEPTUNE II (INSTRUMENTS) ×3 IMPLANT
NDL FILTER BLUNT 18X1 1/2 (NEEDLE) IMPLANT
NDL HYPO 21X1 ECLIPSE (NEEDLE) ×1 IMPLANT
NEEDLE 22X1 1/2 (OR ONLY) (NEEDLE) ×2 IMPLANT
NEEDLE FILTER BLUNT 18X 1/2SAF (NEEDLE) ×2
NEEDLE FILTER BLUNT 18X1 1/2 (NEEDLE) ×1 IMPLANT
NEEDLE HYPO 21X1 ECLIPSE (NEEDLE) IMPLANT
NS IRRIG 1000ML POUR BTL (IV SOLUTION) ×3 IMPLANT
PACK TOTAL JOINT (CUSTOM PROCEDURE TRAY) ×3 IMPLANT
PAD ARMBOARD 7.5X6 YLW CONV (MISCELLANEOUS) ×6 IMPLANT
SET HNDPC FAN SPRY TIP SCT (DISPOSABLE) ×1 IMPLANT
STRIP CLOSURE SKIN 1/2X4 (GAUZE/BANDAGES/DRESSINGS) ×2 IMPLANT
SUT MNCRL AB 3-0 PS2 18 (SUTURE) ×3 IMPLANT
SUT VIC AB 0 CT1 27 (SUTURE) ×3
SUT VIC AB 0 CT1 27XBRD ANBCTR (SUTURE) ×2 IMPLANT
SUT VIC AB 2-0 CT1 27 (SUTURE) ×6
SUT VIC AB 2-0 CT1 TAPERPNT 27 (SUTURE) ×2 IMPLANT
SUT VIC AB 3-0 FS2 27 (SUTURE) ×2 IMPLANT
SUT VLOC 180 0 24IN GS25 (SUTURE) ×3 IMPLANT
SYR 50ML LL SCALE MARK (SYRINGE) ×3 IMPLANT
SYR TB 1ML LUER SLIP (SYRINGE) ×1 IMPLANT
TOWEL OR 17X24 6PK STRL BLUE (TOWEL DISPOSABLE) ×3 IMPLANT
TOWEL OR 17X26 10 PK STRL BLUE (TOWEL DISPOSABLE) ×3 IMPLANT
TRAY CATH 16FR W/PLASTIC CATH (SET/KITS/TRAYS/PACK) IMPLANT
TRAY REVISION SZ 6 (Knees) ×2 IMPLANT

## 2016-05-19 NOTE — Progress Notes (Signed)
Orthopedic Tech Progress Note Patient Details:  Jeremy Bennett 1955-12-29 [id number]  CPM Right Knee CPM Right Knee: On Right Knee Flexion (Degrees): 90 Right Knee Extension (Degrees): 0   Riddhi Grether 05/19/2016, 10:41 AM Trapeze bar patient helper not applied because pt's weight exceeds weight limit durability

## 2016-05-19 NOTE — Interval H&P Note (Signed)
History and Physical Interval Note:  05/19/2016 7:21 AM  Jeremy Bennett  has presented today for surgery, with the diagnosis of RIGHT KNEE DEGENERATIVE JOINT DISEASE  The various methods of treatment have been discussed with the patient and family. After consideration of risks, benefits and other options for treatment, the patient has consented to  Procedure(s): TOTAL KNEE ARTHROPLASTY (Right) as a surgical intervention .  The patient's history has been reviewed, patient examined, no change in status, stable for surgery.  I have reviewed the patient's chart and labs.  Questions were answered to the patient's satisfaction.     Tehani Mersman G

## 2016-05-19 NOTE — Anesthesia Postprocedure Evaluation (Signed)
Anesthesia Post Note  Patient: Jeremy Bennett  Procedure(s) Performed: Procedure(s) (LRB): TOTAL KNEE ARTHROPLASTY (Right)  Patient location during evaluation: PACU Anesthesia Type: Spinal and Regional Level of consciousness: awake and alert Pain management: pain level controlled Vital Signs Assessment: post-procedure vital signs reviewed and stable Respiratory status: spontaneous breathing and respiratory function stable Cardiovascular status: blood pressure returned to baseline and stable Postop Assessment: spinal receding Anesthetic complications: no       Last Vitals:  Vitals:   05/19/16 1115 05/19/16 1130  BP: 107/82 122/83  Pulse:    Resp:  18  Temp:      Last Pain:  Vitals:   05/19/16 0952  TempSrc:   PainSc: 0-No pain                 Bernard Donahoo,W. EDMOND

## 2016-05-19 NOTE — Op Note (Signed)
PREOP DIAGNOSIS: DJD RIGHT KNEE POSTOP DIAGNOSIS: same PROCEDURE: RIGHT TKR ANESTHESIA: Spinal and MAC ATTENDING SURGEON: Aloysuis Ribaudo G ASSISTANTLoni Dolly PA  INDICATIONS FOR PROCEDURE: Jeremy Bennett is a 61 y.o. male who has struggled for a long time with pain due to degenerative arthritis of the right knee.  The patient has failed many conservative non-operative measures and at this point has pain which limits the ability to sleep and walk.  The patient is offered total knee replacement.  Informed operative consent was obtained after discussion of possible risks of anesthesia, infection, neurovascular injury, DVT, and death.  The importance of the post-operative rehabilitation protocol to optimize result was stressed extensively with the patient.  SUMMARY OF FINDINGS AND PROCEDURE:  Jeremy Bennett was taken to the operative suite where under the above anesthesia a right knee replacement was performed.  There were advanced degenerative changes and the bone quality was excellent.  We used the DePuy LCS system and placed size large femur, 6MBT revision tibia, 41 mm all polyethylene patella, and a size 12.5 mm spacer.  Loni Dolly PA-C assisted throughout and was invaluable to the completion of the case in that he helped retract and maintain exposure while I placed components.  He also helped close thereby minimizing OR time.  The patient was admitted for appropriate post-op care to include perioperative antibiotics and mechanical and pharmacologic measures for DVT prophylaxis.  DESCRIPTION OF PROCEDURE:  Jeremy Bennett was taken to the operative suite where the above anesthesia was applied.  The patient was positioned supine and prepped and draped in normal sterile fashion.  An appropriate time out was performed.  After the administration of kefzol pre-op antibiotic the leg was elevated and exsanguinated and a tourniquet inflated. A standard longitudinal incision was made on the anterior knee.   Dissection was carried down to the extensor mechanism.  All appropriate anti-infective measures were used including the pre-operative antibiotic, betadine impregnated drape, and closed hooded exhaust systems for each member of the surgical team.  A medial parapatellar incision was made in the extensor mechanism and the knee cap flipped and the knee flexed.  Some residual meniscal tissues were removed along with any remaining ACL/PCL tissue.  A guide was placed on the tibia and a flat cut was made on it's superior surface.  An intramedullary guide was placed in the femur and was utilized to make anterior and posterior cuts creating an appropriate flexion gap.  A second intramedullary guide was placed in the femur to make a distal cut properly balancing the knee with an extension gap equal to the flexion gap.  The three bones sized to the above mentioned sizes and the appropriate guides were placed and utilized.  A trial reduction was done and the knee easily came to full extension and the patella tracked well on flexion.  The trial components were removed and all bones were cleaned with pulsatile lavage and then dried thoroughly.  Cement was mixed and was pressurized onto the bones followed by placement of the aforementioned components.  Excess cement was trimmed and pressure was held on the components until the cement had hardened.  The tourniquet was deflated and a small amount of bleeding was controlled with cautery and pressure.  The knee was irrigated thoroughly.  The extensor mechanism was re-approximated with V-loc suture in running fashion.  The knee was flexed and the repair was solid.  The subcutaneous tissues were re-approximated with #0 and #2-0 vicryl and the skin closed with  a subcuticular stitch and steristrips.  A sterile dressing was applied.  Intraoperative fluids, EBL, and tourniquet time can be obtained from anesthesia records.  DISPOSITION:  The patient was taken to recovery room in stable  condition and admitted for appropriate post-op care to include peri-operative antibiotic and DVT prophylaxis with mechanical and pharmacologic measures.  Gwendolyn Nishi G 05/19/2016, 9:18 AM

## 2016-05-19 NOTE — Anesthesia Procedure Notes (Signed)
Spinal  Patient location during procedure: OR Start time: 05/19/2016 7:35 AM End time: 05/19/2016 7:40 AM Staffing Anesthesiologist: Roderic Palau Performed: anesthesiologist  Preanesthetic Checklist Completed: patient identified, surgical consent, pre-op evaluation, timeout performed, IV checked, risks and benefits discussed and monitors and equipment checked Spinal Block Patient position: sitting Prep: DuraPrep Patient monitoring: cardiac monitor, continuous pulse ox and blood pressure Approach: midline Location: L3-4 Injection technique: single-shot Needle Needle type: Pencan  Needle gauge: 24 G Needle length: 9 cm Assessment Sensory level: T8 Additional Notes Functioning IV was confirmed and monitors were applied. Sterile prep and drape, including hand hygiene and sterile gloves were used. The patient was positioned and the spine was prepped. The skin was anesthetized with lidocaine.  Free flow of clear CSF was obtained prior to injecting local anesthetic into the CSF.  The spinal needle aspirated freely following injection.  The needle was carefully withdrawn.  The patient tolerated the procedure well.

## 2016-05-19 NOTE — Anesthesia Procedure Notes (Signed)
Anesthesia Regional Block: Adductor canal block   Pre-Anesthetic Checklist: ,, timeout performed, Correct Patient, Correct Site, Correct Laterality, Correct Procedure, Correct Position, site marked, Risks and benefits discussed, pre-op evaluation,  At surgeon's request and post-op pain management  Laterality: Right  Prep: Maximum Sterile Barrier Precautions used, chloraprep       Needles:  Injection technique: Single-shot  Needle Type: Echogenic Stimulator Needle     Needle Length: 9cm  Needle Gauge: 21     Additional Needles:   Procedures: ultrasound guided,,,,,,,,  Narrative:  Start time: 05/19/2016 7:01 AM End time: 05/19/2016 7:11 AM Injection made incrementally with aspirations every 5 mL. Anesthesiologist: Roderic Palau  Additional Notes: 2% Lidocaine skin wheel.

## 2016-05-19 NOTE — Transfer of Care (Signed)
Immediate Anesthesia Transfer of Care Note  Patient: Jeremy Bennett  Procedure(s) Performed: Procedure(s): TOTAL KNEE ARTHROPLASTY (Right)  Patient Location: PACU  Anesthesia Type:Spinal  Level of Consciousness: awake, alert  and patient cooperative  Airway & Oxygen Therapy: Patient Spontanous Breathing and Patient connected to nasal cannula oxygen  Post-op Assessment: Report given to RN, Post -op Vital signs reviewed and stable and Patient able to stick tongue midline  Post vital signs: Reviewed and stable  Last Vitals:  Vitals:   05/19/16 0611  BP: (!) 144/97  Pulse: 78  Resp: 20  Temp: 36.7 C    Last Pain:  Vitals:   05/19/16 0611  TempSrc: Oral  PainSc:       Patients Stated Pain Goal: 4 (64/84/72 0721)  Complications: No apparent anesthesia complications

## 2016-05-19 NOTE — Evaluation (Signed)
Physical Therapy Evaluation Patient Details Name: Jeremy Bennett MRN: [id number] DOB: [birth year] Today's Date: 05/19/2016   History of Present Illness  61 y.o. male admitted to [facility] on 05/19/16 for elective R TKA.  Pt with significant PMHx of HTN, gout, and ADD.    Clinical Impression  Pt is POD #0 and is moving well, min guard assist overall and he was able to walk into the hallway with RW this evening.  He will likely progress well enough to d/c home with family's assist and HHPT f/u.   PT to follow acutely for deficits listed below.       Follow Up Recommendations Home health PT;Supervision for mobility/OOB    Equipment Recommendations  None recommended by PT    Recommendations for Other Services   NA    Precautions / Restrictions Precautions Precautions: Knee Precaution Booklet Issued: Yes (comment) Precaution Comments: knee exercise handout given Required Braces or Orthoses: Knee Immobilizer - Right Knee Immobilizer - Right: Other (comment) ("until discharged") Restrictions Weight Bearing Restrictions: Yes RLE Weight Bearing: Weight bearing as tolerated      Mobility  Bed Mobility Overal bed mobility: Needs Assistance Bed Mobility: Supine to Sit     Supine to sit: Supervision;HOB elevated     General bed mobility comments: supervision with HOB elevated, relying on railing for leverage at trunk.   Transfers Overall transfer level: Needs assistance   Transfers: Sit to/from Stand Sit to Stand: Min assist         General transfer comment: Min assist to support trunk during transitions and stabilize RW.  Verbal cues for safe hand placement.   Ambulation/Gait Ambulation/Gait assistance: Min assist Ambulation Distance (Feet): 45 Feet Assistive device: Rolling walker (2 wheeled) Gait Pattern/deviations: Step-through pattern;Antalgic     General Gait Details: Pt with moderately antalgic giat pattern, verbal cues for safe RW use.           Balance Overall  balance assessment: Needs assistance Sitting-balance support: Feet supported;No upper extremity supported Sitting balance-Leahy Scale: Good     Standing balance support: Bilateral upper extremity supported Standing balance-Leahy Scale: Poor                               Pertinent Vitals/Pain Pain Assessment: 0-10 Pain Score: 6  Pain Location: right knee Pain Descriptors / Indicators: Aching;Burning Pain Intervention(s): Limited activity within patient's tolerance;Monitored during session;Repositioned    Home Living Family/patient expects to be discharged to:: Private residence Living Arrangements: Spouse/significant other Available Help at Discharge: Family;Available 24 hours/day Type of Home: House Home Access: Stairs to enter Entrance Stairs-Rails: None Entrance Stairs-Number of Steps: 2 (one step up, landing, one step down) Home Layout: One level Home Equipment: Walker - 2 wheels;Cane - single point;Crutches      Prior Function Level of Independence: Independent         Comments: is retiring after this surgery     Hand Dominance   Dominant Hand: Right    Extremity/Trunk Assessment   Upper Extremity Assessment Upper Extremity Assessment: Defer to OT evaluation    Lower Extremity Assessment Lower Extremity Assessment: RLE deficits/detail RLE Deficits / Details: right leg with normal post op pain and weakness.  Ankle at least 3/5, knee 2/5, hip flexion 3-/5.  RLE Sensation:  (intact to LT)    Cervical / Trunk Assessment Cervical / Trunk Assessment: Normal  Communication   Communication: No difficulties  Cognition Arousal/Alertness: Awake/alert Behavior During Therapy:  WFL for tasks assessed/performed Overall Cognitive Status: Within Functional Limits for tasks assessed                                           Exercises Total Joint Exercises Ankle Circles/Pumps: AROM;Both;20 reps   Assessment/Plan    PT Assessment  Patient needs continued PT services  PT Problem List Decreased strength;Decreased range of motion;Decreased activity tolerance;Decreased balance;Decreased mobility;Decreased knowledge of use of DME;Decreased knowledge of precautions;Pain       PT Treatment Interventions DME instruction;Gait training;Stair training;Functional mobility training;Therapeutic activities;Therapeutic exercise;Balance training;Patient/family education;Modalities;Manual techniques    PT Goals (Current goals can be found in the Care Plan section)  Acute Rehab PT Goals Patient Stated Goal: to get better so he can enjoy retirement and keep up with his 7 grandkids PT Goal Formulation: With patient Time For Goal Achievement: 05/26/16 Potential to Achieve Goals: Good    Frequency 7X/week    End of Session Equipment Utilized During Treatment: Gait belt;Right knee immobilizer Activity Tolerance: Patient limited by pain Patient left: in chair;with call bell/phone within reach;with family/visitor present Nurse Communication: Mobility status PT Visit Diagnosis: Muscle weakness (generalized) (M62.81);Difficulty in walking, not elsewhere classified (R26.2);Pain Pain - Right/Left: Right Pain - part of body: Knee    Time: 4098-1191 PT Time Calculation (min) (ACUTE ONLY): 28 min   Charges:        Wells Guiles B. Taegan Standage, PT, DPT [phone]   PT Evaluation $PT Eval Moderate Complexity: 1 Procedure PT Treatments $Gait Training: 8-22 mins   05/19/2016, 10:12 PM

## 2016-05-20 ENCOUNTER — Encounter (HOSPITAL_COMMUNITY): Payer: Self-pay | Admitting: Orthopaedic Surgery

## 2016-05-20 NOTE — Progress Notes (Signed)
Subjective: 1 Day Post-Op Procedure(s) (LRB): TOTAL KNEE ARTHROPLASTY (Right)  Activity level:  wbat Diet tolerance:  ok Voiding:  ok Patient reports pain as moderate.    Objective: Vital signs in last 24 hours: Temp:  [97.8 F (36.6 C)-98.3 F (36.8 C)] 97.9 F (36.6 C) (04/25 0414) Pulse Rate:  [59-87] 77 (04/25 0414) Resp:  [11-18] 16 (04/25 0414) BP: (91-139)/(51-86) 135/80 (04/25 0414) SpO2:  [92 %-98 %] 96 % (04/25 0414)  Labs: No results for input(s): HGB in the last 72 hours. No results for input(s): WBC, RBC, HCT, PLT in the last 72 hours. No results for input(s): NA, K, CL, CO2, BUN, CREATININE, GLUCOSE, CALCIUM in the last 72 hours. No results for input(s): LABPT, INR in the last 72 hours.  Physical Exam:  Neurologically intact ABD soft Neurovascular intact Sensation intact distally Intact pulses distally Dorsiflexion/Plantar flexion intact Incision: dressing C/D/I and no drainage No cellulitis present Compartment soft  Assessment/Plan:  1 Day Post-Op Procedure(s) (LRB): TOTAL KNEE ARTHROPLASTY (Right) Advance diet Up with therapy D/C IV fluids Plan for discharge tomorrow Discharge home with home health if doing well and cleared by PT. Follow up in office 2 weeks post op. Continue on ASA 325mg  BID x 2 weeks post op for DVT prevention.  Jeremy Bennett, Jeremy Bennett 05/20/2016, 8:04 AM

## 2016-05-20 NOTE — Progress Notes (Signed)
Physical Therapy Treatment Patient Details Name: Jeremy Bennett MRN: [id number] DOB: 02-07-1955 Today's Date: 05/20/2016    History of Present Illness 61 y.o. male admitted to [facility] on 05/19/16 for elective R TKA.  Pt with significant PMHx of HTN, gout, and ADD.      PT Comments    Patient was seen with wife present. Had pt perform HEP and ambulation with knee immobilizer on and explained the importance of keeping knee in extension when at rest. Patient ambulated 370 feet with frequent standing rest breaks due to pain. Instructed pt on pursed lipped breathing as pt appeared out of breath during ambulation. Plan to practice stairs in the afternoon as pt has 2 steps to enter the home. Patient should benefit from continued skilled PT. Will continue to follow acutely.     Follow Up Recommendations  Home health PT;Supervision for mobility/OOB     Equipment Recommendations  None recommended by PT    Recommendations for Other Services       Precautions / Restrictions Precautions Precautions: Knee Precaution Booklet Issued: Yes (comment) Precaution Comments: knee exercise handout given Required Braces or Orthoses: Knee Immobilizer - Right Knee Immobilizer - Right: Other (comment) ("until discharge") Restrictions Weight Bearing Restrictions: Yes RLE Weight Bearing: Weight bearing as tolerated    Mobility  Bed Mobility Overal bed mobility: Needs Assistance Bed Mobility: Supine to Sit     Supine to sit: Supervision;HOB elevated     General bed mobility comments: supervision with HOB elevated, relying on railing for leverage at trunk.   Transfers Overall transfer level: Needs assistance   Transfers: Sit to/from Stand Sit to Stand: Min guard         General transfer comment: VC for hand placement when powering up to RW.  Ambulation/Gait Ambulation/Gait assistance: Min guard Ambulation Distance (Feet): 370 Feet Assistive device: Rolling walker (2 wheeled) Gait  Pattern/deviations: Step-through pattern;Antalgic   Gait velocity interpretation: Below normal speed for age/gender General Gait Details: VC for postural control in walker. Pt required frequent rest breaks and cueing for pursed lip breathing as pt sounded out of breath.   Stairs            Wheelchair Mobility    Modified Rankin (Stroke Patients Only)       Balance Overall balance assessment: Needs assistance Sitting-balance support: Feet supported;No upper extremity supported Sitting balance-Leahy Scale: Good     Standing balance support: Bilateral upper extremity supported Standing balance-Leahy Scale: Poor                              Cognition Arousal/Alertness: Awake/alert Behavior During Therapy: WFL for tasks assessed/performed Overall Cognitive Status: Within Functional Limits for tasks assessed                                        Exercises Total Joint Exercises Quad Sets: AROM;Right;10 reps;Supine Towel Squeeze: AROM;Both;10 reps;Supine Hip ABduction/ADduction: AROM;Right;10 reps;Supine Straight Leg Raises: AROM;Right;10 reps;Supine    General Comments        Pertinent Vitals/Pain Pain Assessment: 0-10 Pain Score: 8  Pain Location: right knee Pain Descriptors / Indicators: Aching;Burning Pain Intervention(s): Limited activity within patient's tolerance;Monitored during session;Repositioned;Patient requesting pain meds-RN notified    Home Living  Prior Function            PT Goals (current goals can now be found in the care plan section) Acute Rehab PT Goals Patient Stated Goal: to get better so he can enjoy retirement and keep up with his 7 grandkids PT Goal Formulation: With patient Time For Goal Achievement: 05/26/16 Potential to Achieve Goals: Good    Frequency    7X/week      PT Plan Current plan remains appropriate    Co-evaluation             End of Session  Equipment Utilized During Treatment: Gait belt;Right knee immobilizer Activity Tolerance: Patient tolerated treatment well;Patient limited by pain Patient left: in chair;with call bell/phone within reach;with nursing/sitter in room;with family/visitor present Nurse Communication: Mobility status PT Visit Diagnosis: Muscle weakness (generalized) (M62.81);Difficulty in walking, not elsewhere classified (R26.2);Pain Pain - Right/Left: Right Pain - part of body: Knee     Time: 0300-9233 PT Time Calculation (min) (ACUTE ONLY): 48 min  Charges:  $Gait Training: 23-37 mins $Therapeutic Exercise: 8-22 mins                    G Codes:       [name], PTA Acute Rehab [phone]   Allena Katz 05/20/2016, 10:35 AM

## 2016-05-20 NOTE — Evaluation (Signed)
Occupational Therapy Evaluation Patient Details Name: Jeremy Bennett MRN: [id number] DOB: [birth year] Today's Date: 05/20/2016    History of Present Illness 61 y.o. male admitted to [facility] on 05/19/16 for elective R TKA.  Pt with significant PMHx of HTN, gout, and ADD.     Clinical Impression   Patient is s/p R TKA surgery resulting in functional limitations due to the deficits listed below (see OT problem list). PTA was independent with all adls and working full time.  Patient will benefit from skilled OT acutely to increase independence and safety with ADLS to allow discharge home with wife 24/7 (A).     Follow Up Recommendations  No OT follow up    Equipment Recommendations  None recommended by OT    Recommendations for Other Services       Precautions / Restrictions Precautions Precautions: Knee Required Braces or Orthoses: Knee Immobilizer - Right Knee Immobilizer - Right: On except when in CPM;Discontinue once straight leg raise with < 10 degree lag Restrictions Weight Bearing Restrictions: Yes RLE Weight Bearing: Weight bearing as tolerated      Mobility Bed Mobility               General bed mobility comments: in chair at this time and encouraged to sit up for luncha nd then return to bed  Transfers Overall transfer level: Needs assistance   Transfers: Sit to/from Stand Sit to Stand: Supervision         General transfer comment: good transfer with good hand placement    Balance                                           ADL either performed or assessed with clinical judgement   ADL Overall ADL's : Needs assistance/impaired Eating/Feeding: Independent   Grooming: Wash/dry hands;Wash/dry face;Supervision/safety       Lower Body Bathing: Minimal assistance Lower Body Bathing Details (indicate cue type and reason): pt able to reach and cross L LE but needs (A) R foot       Lower Body Dressing Details (indicate cue type and  reason): educated on reacher and will have wife (A) 24/7 at this time Toilet Transfer: Supervision/safety           Functional mobility during ADLs: Supervision/safety General ADL Comments: pt educated on ae at [place] stime and plans to have wife (A). pt wears sandals normally year round. pt educated on using a shoe with a back.    Pt educated on bathing and avoid washing directly on incision. Pt educated to use new wash cloth and towel each day. Pt educated to allow water to run across dressing and not to soak in a tub at this time. Pt advised RN will instruct on any bandages required otherwise is open to air.   Educated with wife on [place]. Pt has machine for ice at home and requesting to use from previous knee surgery.   Vision Baseline Vision/History: Wears glasses Wears Glasses: At all times       Perception     Praxis      Pertinent Vitals/Pain Pain Location: right knee Pain Descriptors / Indicators: Aching;Burning     Hand Dominance Right   Extremity/Trunk Assessment Upper Extremity Assessment Upper Extremity Assessment: Overall WFL for tasks assessed   Lower Extremity Assessment Lower Extremity Assessment: Defer to PT evaluation   Cervical /  Trunk Assessment Cervical / Trunk Assessment: Normal   Communication Communication Communication: No difficulties   Cognition Arousal/Alertness: Awake/alert Behavior During Therapy: WFL for tasks assessed/performed Overall Cognitive Status: Within Functional Limits for tasks assessed                                     General Comments  ice applied to knee to help with pain management    Exercises     Shoulder Instructions      Home Living Family/patient expects to be discharged to:: Private residence Living Arrangements: Spouse/significant other Available Help at Discharge: Family;Available 24 hours/day Type of Home: House Home Access: Stairs to enter [employer] of Steps:  2 Entrance Stairs-Rails: None Home Layout: One level     Bathroom Shower/Tub: Teacher, early years/pre: Handicapped height     Home Equipment: Environmental consultant - 2 wheels;Cane - single point;Crutches;Hand held shower head   Additional Comments: used a cane at baseline      Prior Functioning/Environment Level of Independence: Independent        Comments: is retiring after this surgery        OT Problem List: Decreased strength;Decreased activity tolerance;Impaired balance (sitting and/or standing);Decreased knowledge of use of DME or AE;Decreased safety awareness;Decreased knowledge of precautions;Pain      OT Treatment/Interventions: Self-care/ADL training;Therapeutic exercise;DME and/or AE instruction;Therapeutic activities;Patient/family education;Balance training    OT Goals(Current goals can be found in the care plan section) Acute Rehab OT Goals Patient Stated Goal: to retire and enjoy it. states his best buddies from work just both died at 11 yos OT Goal Formulation: With patient Time For Goal Achievement: 06/03/16 Potential to Achieve Goals: Good  OT Frequency: Min 2X/week   Barriers to D/C:            Co-evaluation              End of Session Equipment Utilized During Treatment: Rolling walker;Right knee immobilizer CPM Right Knee CPM Right Knee: Off Nurse Communication: Mobility status;Precautions;Weight bearing status  Activity Tolerance: Patient tolerated treatment well Patient left: in chair;with call bell/phone within reach;with family/visitor present  OT Visit Diagnosis: Unsteadiness on feet (R26.81)                Time: 9892-1194 OT Time Calculation (min): 31 min Charges:  OT General Charges $OT Visit: 1 Procedure OT Evaluation $OT Eval Moderate Complexity: 1 Procedure OT Treatments $Self Care/Home Management : 8-22 mins G-Codes:      Jeri Modena   OTR/L Pager: [phone] Office: [phone] .   Parke Poisson B 05/20/2016, 3:58  PM

## 2016-05-20 NOTE — Progress Notes (Signed)
Physical Therapy Treatment Patient Details Name: Jeremy Bennett MRN: [id number] DOB: [birth year] Today's Date: 05/20/2016    History of Present Illness 61 y.o. male admitted to [facility] on 05/19/16 for elective R TKA.  Pt with significant PMHx of HTN, gout, and ADD.      PT Comments    Pt is POD #1 and this is his second session.  He was limited by pain and soreness this PM, so we deferred stair training until tomorrow.  He continues to mobilize OOB well and walked a good distance down the hallway with supervision.  His wife has been helping him up around the room.  Plan to d/c in AM after PT session for stair training.    Follow Up Recommendations  Home health PT;Supervision for mobility/OOB     Equipment Recommendations  None recommended by PT    Recommendations for Other Services   NA     Precautions / Restrictions Precautions Precautions: Knee Precaution Booklet Issued: Yes (comment) Precaution Comments: knee exercise handout given, no pillow under operative knee reviewed Required Braces or Orthoses: Knee Immobilizer - Right Knee Immobilizer - Right: On except when in CPM;Discontinue once straight leg raise with < 10 degree lag Restrictions Weight Bearing Restrictions: Yes RLE Weight Bearing: Weight bearing as tolerated    Mobility  Bed Mobility Overal bed mobility: Needs Assistance Bed Mobility: Supine to Sit;Sit to Supine     Supine to sit: Supervision;HOB elevated Sit to supine: Min assist   General bed mobility comments: Supervision for pt to used bed raol to come to EOB, min assist to help lift right leg into bed.   Transfers Overall transfer level: Needs assistance Equipment used: Rolling walker (2 wheeled) Transfers: Sit to/from Stand Sit to Stand: Supervision         General transfer comment: SUpervision for safety, verbal cues for safe hand placement.   Ambulation/Gait Ambulation/Gait assistance: Supervision Ambulation Distance (Feet): 130  Feet Assistive device: Rolling walker (2 wheeled) Gait Pattern/deviations: Step-through pattern;Antalgic Gait velocity: decreased Gait velocity interpretation: Below normal speed for age/gender General Gait Details: verbal cues for upright posture, cues to reinforce correct LE sequencing.  Slow gait speed with multiple standing rest breaks.  Supervision for safety   Stairs Stairs: Yes       General stair comments: Pt was too painful this PM to initiate stair training.  He only has a curb step x 2, so stair training deferred until tomorrow AM before d/c        Balance Overall balance assessment: Needs assistance Sitting-balance support: Feet supported;No upper extremity supported Sitting balance-Leahy Scale: Good     Standing balance support: Bilateral upper extremity supported Standing balance-Leahy Scale: Poor                              Cognition Arousal/Alertness: Awake/alert Behavior During Therapy: WFL for tasks assessed/performed Overall Cognitive Status: Within Functional Limits for tasks assessed                                        Exercises Total Joint Exercises Short Arc Quad: Right;10 reps;AAROM Hip ABduction/ADduction: AAROM;Right;10 reps Straight Leg Raises: AAROM;Right;10 reps Goniometric ROM: 12-65        Pertinent Vitals/Pain Pain Assessment: 0-10 Pain Score: 8  Pain Location: right knee Pain Descriptors / Indicators: Aching;Burning Pain Intervention(s): Limited activity within  patient's tolerance;Monitored during session;Repositioned;Ice applied           PT Goals (current goals can now be found in the care plan section) Acute Rehab PT Goals Patient Stated Goal: to retire and enjoy it. states his best buddies from work just both died at 69 yos Progress towards PT goals: Progressing toward goals    Frequency    7X/week      PT Plan Current plan remains appropriate       End of Session Equipment  Utilized During Treatment: Right knee immobilizer Activity Tolerance: Patient limited by pain Patient left: in bed;in CPM;with call bell/phone within reach;with family/visitor present   PT Visit Diagnosis: Muscle weakness (generalized) (M62.81);Difficulty in walking, not elsewhere classified (R26.2);Pain Pain - Right/Left: Right Pain - part of body: Knee     Time: 7858-8502 PT Time Calculation (min) (ACUTE ONLY): 30 min  Charges:  $Gait Training: 8-22 mins $Therapeutic Exercise: 8-22 mins          Tim Wilhide B. [place], [place], DPT [phone]            05/20/2016, 11:14 PM

## 2016-05-21 MED ORDER — BISACODYL 5 MG PO TBEC: 5.0000 mg | DELAYED_RELEASE_TABLET | Freq: Every day | ORAL | 0 refills | Status: DC | PRN

## 2016-05-21 MED ORDER — OXYCODONE-ACETAMINOPHEN 5-325 MG PO TABS
1.0000 | ORAL_TABLET | ORAL | Status: DC | PRN
  Administered 2016-05-21: 1 via ORAL
  Administered 2016-05-21: 2 via ORAL
  Filled 2016-05-21: qty 1
  Filled 2016-05-21: qty 2

## 2016-05-21 MED ORDER — METHOCARBAMOL 500 MG PO TABS
500.0000 mg | ORAL_TABLET | Freq: Four times a day (QID) | ORAL | 0 refills | Status: DC | PRN
Start: 2016-05-21 — End: 2016-10-08

## 2016-05-21 MED ORDER — ASPIRIN 325 MG PO TBEC: 325.0000 mg | DELAYED_RELEASE_TABLET | Freq: Two times a day (BID) | ORAL | 0 refills | Status: DC

## 2016-05-21 MED ORDER — DOCUSATE SODIUM 100 MG PO CAPS: 100.0000 mg | ORAL_CAPSULE | Freq: Two times a day (BID) | ORAL | 0 refills | Status: DC

## 2016-05-21 MED ORDER — OXYCODONE-ACETAMINOPHEN 5-325 MG PO TABS: 1.0000 | ORAL_TABLET | ORAL | 0 refills | Status: DC | PRN

## 2016-05-21 NOTE — Progress Notes (Signed)
Physical Therapy Treatment Patient Details Name: Jeremy Bennett MRN: [id number] DOB: [birth year] Today's Date: 05/21/2016    History of Present Illness 61 y.o. male admitted to [facility] on 05/19/16 for elective R TKA.  Pt with significant PMHx of HTN, gout, and ADD.      PT Comments    Patient was seen with wife present. He is expected to d/c today. Provided pt instruction on negotiating curbs to simulate home environment. Pt was min guard with curb and supervision with gait for 300 feet. Patient would benefit from continued skilled PT. Will continue to follow acutely if still here.   Follow Up Recommendations  Home health PT;Supervision for mobility/OOB     Equipment Recommendations  None recommended by PT    Recommendations for Other Services       Precautions / Restrictions Precautions Precautions: Knee Precaution Booklet Issued: Yes (comment) Precaution Comments: knee exercise handout given, no pillow under operative knee reviewed Required Braces or Orthoses: Knee Immobilizer - Right Knee Immobilizer - Right: On except when in CPM;Discontinue once straight leg raise with < 10 degree lag Restrictions Weight Bearing Restrictions: Yes RLE Weight Bearing: Weight bearing as tolerated    Mobility  Bed Mobility Overal bed mobility: Modified Independent Bed Mobility: Supine to Sit;Sit to Supine     Supine to sit: HOB elevated;Modified independent (Device/Increase time) Sit to supine: Modified independent (Device/Increase time)   General bed mobility comments: Pt requires use of one bed rail to perform bed mobilities  Transfers Overall transfer level: Needs assistance Equipment used: Rolling walker (2 wheeled) Transfers: Sit to/from Stand Sit to Stand: Supervision         General transfer comment: Supervision for safety and proper walker placement when transfering from stand to sit.  Ambulation/Gait Ambulation/Gait assistance: Supervision Ambulation Distance (Feet): 300  Feet Assistive device: Rolling walker (2 wheeled) Gait Pattern/deviations: Step-through pattern;Antalgic Gait velocity: decreased       Stairs Stairs: Yes   Stair Management: No rails;Step to pattern;Forwards;With walker Number of Stairs: 3 General stair comments: Pt has two curp steps to enter house. Instructed pt and spouce on proper technqiue to ascend and descend curb with RW. Pt demonstrated and verablized understanding.  Wheelchair Mobility    Modified Rankin (Stroke Patients Only)       Balance Overall balance assessment: Needs assistance Sitting-balance support: Feet supported;No upper extremity supported Sitting balance-Leahy Scale: Good     Standing balance support: Bilateral upper extremity supported Standing balance-Leahy Scale: Fair Standing balance comment: Pt performed static standing in RW without UE support for 30 seconds                            Cognition Arousal/Alertness: Awake/alert Behavior During Therapy: WFL for tasks assessed/performed Overall Cognitive Status: Within Functional Limits for tasks assessed                                        Exercises      General Comments        Pertinent Vitals/Pain Pain Assessment: 0-10 Pain Score: 0-No pain Pain Location: right knee/stomach Pain Descriptors / Indicators: Discomfort Pain Intervention(s): Monitored during session    Home Living                      Prior Function  PT Goals (current goals can now be found in the care plan section) Acute Rehab PT Goals Patient Stated Goal: to retire and enjoy it. states his best buddies from work just both died at 48 yos PT Goal Formulation: With patient Time For Goal Achievement: 05/26/16 Potential to Achieve Goals: Good Progress towards PT goals: Progressing toward goals    Frequency    7X/week      PT Plan Current plan remains appropriate    Co-evaluation             End  of Session Equipment Utilized During Treatment: Right knee immobilizer Activity Tolerance: Patient limited by fatigue Patient left: in bed;in CPM;with call bell/phone within reach;with family/visitor present   PT Visit Diagnosis: Muscle weakness (generalized) (M62.81);Difficulty in walking, not elsewhere classified (R26.2);Pain Pain - Right/Left: Right Pain - part of body: Knee     Time: 1610-9604 PT Time Calculation (min) (ACUTE ONLY): 25 min  Charges:  $Gait Training: 23-37 mins                    G Codes:      [name], PTA Acute Rehab [place] 05/21/2016, 1:37 PM

## 2016-05-21 NOTE — Care Management Note (Signed)
Case Management Note  Patient Details  Name: Jeremy Bennett MRN: [id number] Date of Birth: [birth year]  Subjective/Objective:  61 yr old gentleman s/p right total knee arthroplasty.                  Action/Plan: Case manager spoke with patient and wife concerning discharge plan and DME. Referral was called to [place], [facility]. Patient has received RW and 3in1. Cpm has been delivered to his home. He will have family support at discharge.    Expected Discharge Date:  05/21/16               Expected Discharge Plan:  [place]  In-House Referral:     Discharge planning Services  CM Consult  Post Acute Care Choice:  Home Health Choice offered to:  Patient  DME Arranged:  CPM (has RW and 3in1) DME Agency:  [employer]  HH Arranged:  PT HH Agency:  [place]  Status of Service:  Completed, signed off  If discussed at [place] of Stay Meetings, dates discussed:    Additional Comments:  Ninfa Meeker, RN 05/21/2016, 1:43 PM

## 2016-05-21 NOTE — Progress Notes (Signed)
Reviewed discharge information with patient and patient's wife.  Answered their questions. Patient is ready for discharge.

## 2016-05-21 NOTE — Discharge Summary (Signed)
Patient ID: Jeremy Bennett MRN: [id number] DOB/AGE: [birth year] 61 y.o.  Admit date: 05/19/2016 Discharge date: 05/21/2016  Admission Diagnoses:  Principal Problem:   Primary localized osteoarthritis of right knee Active Problems:   Primary osteoarthritis of right knee   Discharge Diagnoses:  Same  Past Medical History:  Diagnosis Date  . ADD (attention deficit disorder)   . Arthritis   . Gout   . History of asbestosis   . Hypertension   . Sleep apnea    tested more than 5 yrs ago    Surgeries: Procedure(s): TOTAL KNEE ARTHROPLASTY on 05/19/2016   Consultants:   Discharged Condition: Improved  Hospital Course: SURAJ RAMDASS is an 61 y.o. male who was admitted 05/19/2016 for operative treatment ofPrimary localized osteoarthritis of right knee. Patient has severe unremitting pain that affects sleep, daily activities, and work/hobbies. After pre-op clearance the patient was taken to the operating room on 05/19/2016 and underwent  Procedure(s): TOTAL KNEE ARTHROPLASTY.    Patient was given perioperative antibiotics: Anti-infectives    Start     Dose/Rate Route Frequency Ordered Stop   05/19/16 1600  ceFAZolin (ANCEF) IVPB 2g/100 mL premix     2 g 200 mL/hr over 30 Minutes Intravenous Every 6 hours 05/19/16 1027 05/19/16 2337   05/19/16 0700  ceFAZolin (ANCEF) 3 g in dextrose 5 % 50 mL IVPB     3 g 130 mL/hr over 30 Minutes Intravenous To ShortStay Surgical 05/18/16 0914 05/19/16 0745       Patient was given sequential compression devices, early ambulation, and chemoprophylaxis to prevent DVT.  Patient benefited maximally from hospital stay and there were no complications.    Recent vital signs: Patient Vitals for the past 24 hrs:  BP Temp Temp src Pulse Resp SpO2  05/21/16 0522 121/85 98.6 F (37 C) Oral 89 18 99 %  05/20/16 1900 (!) 144/80 98 F (36.7 C) Oral 85 16 97 %  05/20/16 1300 125/72 98.2 F (36.8 C) Oral (!) 106 16 96 %  05/20/16 1019 (!) 141/92 97.7 F  (36.5 C) Oral 97 16 95 %     Recent laboratory studies: No results for input(s): WBC, HGB, HCT, PLT, NA, K, CL, CO2, BUN, CREATININE, GLUCOSE, INR, CALCIUM in the last 72 hours.  Invalid input(s): PT, 2   Discharge Medications:   Allergies as of 05/21/2016      Reactions   No Known Allergies       Medication List    STOP taking these medications   ALEVE PM 220-25 MG Tabs Generic drug:  Naproxen Sod-Diphenhydramine   ibuprofen 200 MG tablet Commonly known as:  ADVIL,MOTRIN   naproxen 500 MG tablet Commonly known as:  NAPROSYN   naproxen sodium 220 MG tablet Commonly known as:  ANAPROX     TAKE these medications   acetaminophen 325 MG tablet Commonly known as:  TYLENOL Take 650 mg by mouth every 6 (six) hours as needed for mild pain.   aspirin 325 MG EC tablet Take 1 tablet (325 mg total) by mouth 2 (two) times daily after a meal.   bisacodyl 5 MG EC tablet Commonly known as:  DULCOLAX Take 1 tablet (5 mg total) by mouth daily as needed for moderate constipation.   docusate sodium 100 MG capsule Commonly known as:  COLACE Take 1 capsule (100 mg total) by mouth 2 (two) times daily.   hydrochlorothiazide 12.5 MG capsule Commonly known as:  MICROZIDE Take 12.5 mg by mouth daily.  lisinopril 20 MG tablet Commonly known as:  PRINIVIL,ZESTRIL Take 20 mg by mouth daily.   MAGNESIUM OXIDE PO Take 1 tablet by mouth at bedtime as needed (leg cramps).   Melatonin 5 MG Caps Take 5 mg by mouth at bedtime as needed (sleep).   methocarbamol 500 MG tablet Commonly known as:  ROBAXIN Take 1 tablet (500 mg total) by mouth every 6 (six) hours as needed for muscle spasms.   oxyCODONE-acetaminophen 5-325 MG tablet Commonly known as:  ROXICET Take 1-2 tablets by mouth every 4 (four) hours as needed for severe pain.   oxymetazoline 0.05 % nasal spray Commonly known as:  AFRIN Place 1 spray into both nostrils at bedtime as needed for congestion.   PARoxetine 20 MG  tablet Commonly known as:  PAXIL TAKE 1 TABLET BY MOUTH EVERY MORNING   triamcinolone cream 0.1 % Commonly known as:  KENALOG Apply 1 application topically 2 (two) times daily as needed (rash).   TURMERIC CURCUMIN PO Take 1 tablet by mouth daily.            Durable Medical Equipment        Start     Ordered   05/19/16 1445  DME Walker rolling  Once    Question:  Patient needs a walker to treat with the following condition  Answer:  Primary osteoarthritis of right knee   05/19/16 1444   05/19/16 1445  DME 3 n 1  Once     05/19/16 1444   05/19/16 1445  DME Bedside commode  Once    Question:  Patient needs a bedside commode to treat with the following condition  Answer:  Primary osteoarthritis of right knee   05/19/16 1444      Diagnostic Studies: Dg Chest 2 View  Result Date: 05/19/2016 CLINICAL DATA:  Preop knee replacement EXAM: CHEST  2 VIEW COMPARISON:  07/14/2005 FINDINGS: The heart size and mediastinal contours are within normal limits. Both lungs are clear. The visualized skeletal structures are unremarkable. IMPRESSION: No active cardiopulmonary disease. Electronically Signed   By: Ulyses Jarred M.D.   On: 05/19/2016 06:34    Disposition:   Discharge Instructions    Call MD / Call 911    Complete by:  As directed    If you experience chest pain or shortness of breath, CALL 911 and be transported to the hospital emergency room.  If you develope a fever above 101 F, pus (white drainage) or increased drainage or redness at the wound, or calf pain, call your surgeon's office.   Constipation Prevention    Complete by:  As directed    Drink plenty of fluids.  Prune juice may be helpful.  You may use a stool softener, such as Colace (over the counter) 100 mg twice a day.  Use MiraLax (over the counter) for constipation as needed.   Diet - low sodium heart healthy    Complete by:  As directed    Discharge instructions    Complete by:  As directed    INSTRUCTIONS AFTER  JOINT REPLACEMENT   Remove items at home which could result in a fall. This includes throw rugs or furniture in walking pathways ICE to the affected joint every three hours while awake for 30 minutes at a time, for at least the first 3-5 days, and then as needed for pain and swelling.  Continue to use ice for pain and swelling. You may notice swelling that will progress down to the foot and  ankle.  This is normal after surgery.  Elevate your leg when you are not up walking on it.   Continue to use the breathing machine you got in the hospital (incentive spirometer) which will help keep your temperature down.  It is common for your temperature to cycle up and down following surgery, especially at night when you are not up moving around and exerting yourself.  The breathing machine keeps your lungs expanded and your temperature down.   DIET:  As you were doing prior to hospitalization, we recommend a well-balanced diet.  DRESSING / WOUND CARE / SHOWERING  You may shower 3 days after surgery, but keep the wounds dry during showering.  You may use an occlusive plastic wrap (Press'n Seal for example), NO SOAKING/SUBMERGING IN THE BATHTUB.  If the bandage gets wet, change with a clean dry gauze.  If the incision gets wet, pat the wound dry with a clean towel.  ACTIVITY  Increase activity slowly as tolerated, but follow the weight bearing instructions below.   No driving for 6 weeks or until further direction given by your physician.  You cannot drive while taking narcotics.  No lifting or carrying greater than 10 lbs. until further directed by your surgeon. Avoid periods of inactivity such as sitting longer than an hour when not asleep. This helps prevent blood clots.  You may return to work once you are authorized by your doctor.     WEIGHT BEARING   Weight bearing as tolerated with assist device (walker, cane, etc) as directed, use it as long as suggested by your surgeon or therapist, typically  at least 4-6 weeks.   EXERCISES  Results after joint replacement surgery are often greatly improved when you follow the exercise, range of motion and muscle strengthening exercises prescribed by your doctor. Safety measures are also important to protect the joint from further injury. Any time any of these exercises cause you to have increased pain or swelling, decrease what you are doing until you are comfortable again and then slowly increase them. If you have problems or questions, call your caregiver or physical therapist for advice.   Rehabilitation is important following a joint replacement. After just a few days of immobilization, the muscles of the leg can become weakened and shrink (atrophy).  These exercises are designed to build up the tone and strength of the thigh and leg muscles and to improve motion. Often times heat used for twenty to thirty minutes before working out will loosen up your tissues and help with improving the range of motion but do not use heat for the first two weeks following surgery (sometimes heat can increase post-operative swelling).   These exercises can be done on a training (exercise) mat, on the floor, on a table or on a bed. Use whatever works the best and is most comfortable for you.    Use music or television while you are exercising so that the exercises are a pleasant break in your day. This will make your life better with the exercises acting as a break in your routine that you can look forward to.   Perform all exercises about fifteen times, three times per day or as directed.  You should exercise both the operative leg and the other leg as well.   Exercises include:   Quad Sets - Tighten up the muscle on the front of the thigh (Quad) and hold for 5-10 seconds.   Straight Leg Raises - With your knee straight (if  you were given a brace, keep it on), lift the leg to 60 degrees, hold for 3 seconds, and slowly lower the leg.  Perform this exercise against  resistance later as your leg gets stronger.  Leg Slides: Lying on your back, slowly slide your foot toward your buttocks, bending your knee up off the floor (only go as far as is comfortable). Then slowly slide your foot back down until your leg is flat on the floor again.  Angel Wings: Lying on your back spread your legs to the side as far apart as you can without causing discomfort.  Hamstring Strength:  Lying on your back, push your heel against the floor with your leg straight by tightening up the muscles of your buttocks.  Repeat, but this time bend your knee to a comfortable angle, and push your heel against the floor.  You may put a pillow under the heel to make it more comfortable if necessary.   A rehabilitation program following joint replacement surgery can speed recovery and prevent re-injury in the future due to weakened muscles. Contact your doctor or a physical therapist for more information on knee rehabilitation.    CONSTIPATION  Constipation is defined medically as fewer than three stools per week and severe constipation as less than one stool per week.  Even if you have a regular bowel pattern at home, your normal regimen is likely to be disrupted due to multiple reasons following surgery.  Combination of anesthesia, postoperative narcotics, change in appetite and fluid intake all can affect your bowels.   YOU MUST use at least one of the following options; they are listed in order of increasing strength to get the job done.  They are all available over the counter, and you may need to use some, POSSIBLY even all of these options:    Drink plenty of fluids (prune juice may be helpful) and high fiber foods Colace 100 mg by mouth twice a day  Senokot for constipation as directed and as needed Dulcolax (bisacodyl), take with full glass of water  Miralax (polyethylene glycol) once or twice a day as needed.  If you have tried all these things and are unable to have a bowel movement  in the first 3-4 days after surgery call either your surgeon or your primary doctor.    If you experience loose stools or diarrhea, hold the medications until you stool forms back up.  If your symptoms do not get better within 1 week or if they get worse, check with your doctor.  If you experience "the worst abdominal pain ever" or develop nausea or vomiting, please contact the office immediately for further recommendations for treatment.   ITCHING:  If you experience itching with your medications, try taking only a single pain pill, or even half a pain pill at a time.  You can also use Benadryl over the counter for itching or also to help with sleep.   TED HOSE STOCKINGS:  Use stockings on both legs until for at least 2 weeks or as directed by physician office. They may be removed at night for sleeping.  MEDICATIONS:  See your medication summary on the "After Visit Summary" that nursing will review with you.  You may have some home medications which will be placed on hold until you complete the course of blood thinner medication.  It is important for you to complete the blood thinner medication as prescribed.  PRECAUTIONS:  If you experience chest pain or shortness of breath -  call 911 immediately for transfer to the hospital emergency department.   If you develop a fever greater that 101 F, purulent drainage from wound, increased redness or drainage from wound, foul odor from the wound/dressing, or calf pain - CONTACT YOUR SURGEON.                                                   FOLLOW-UP APPOINTMENTS:  If you do not already have a post-op appointment, please call the office for an appointment to be seen by your surgeon.  Guidelines for how soon to be seen are listed in your "After Visit Summary", but are typically between 1-4 weeks after surgery.  OTHER INSTRUCTIONS:   Knee Replacement:  Do not place pillow under knee, focus on keeping the knee straight while resting. CPM instructions: 0-90  degrees, 2 hours in the morning, 2 hours in the afternoon, and 2 hours in the evening. Place foam block, curve side up under heel at all times except when in CPM or when walking.  DO NOT modify, tear, cut, or change the foam block in any way.  MAKE SURE YOU:  Understand these instructions.  Get help right away if you are not doing well or get worse.    Thank you for letting us be a part of your medical care team.  It is a privilege we respect greatly.  We hope these instructions will help you stay on track for a fast and full recovery!   Increase activity slowly as tolerated    Complete by:  As directed       Follow-up Information    DALLDORF,PETER G, MD. Schedule an appointment as soon as possible for a visit in 2 weeks.   Specialty:  Orthopedic Surgery Contact information: [place] [address] [phone]            Signed: Rich Fuchs 05/21/2016, 6:47 AM

## 2016-05-21 NOTE — Progress Notes (Signed)
Occupational Therapy Treatment Patient Details Name: Jeremy Bennett MRN: [id number] DOB: [birth year] Today's Date: 05/21/2016    History of present illness 61 y.o. male admitted to [facility] on 05/19/16 for elective R TKA.  Pt with significant PMHx of HTN, gout, and ADD.     OT comments  Pt nauseated this am. Completed education with pt/wife regarding compensatory techniqeeus and functional mobility for tub transfers using drop arm wide 3in1 BSC. Pt/wife verbalized understanding. Pt safe to DC home when medically stable. OT signing off.   Follow Up Recommendations  No OT follow up    Equipment Recommendations       Recommendations for Other Services      Precautions / Restrictions Precautions Precautions: Knee Required Braces or Orthoses: Knee Immobilizer - Right Knee Immobilizer - Right: On except when in CPM;Discontinue once straight leg raise with < 10 degree lag Restrictions RLE Weight Bearing: Weight bearing as tolerated       Mobility Bed Mobility                  Transfers                      Balance                                           ADL either performed or assessed with clinical judgement   ADL                                         General ADL Comments: completed education with pt/wife regarding home safety, reducing risk of falls, compensatory technqieus for ADL and sfae transfer technique for tub. Pt/wife expressed concerns regarding ability to step over tub. Pt's safest option is to use a drop arm wide 3in1 as a tub seat. wife/pt verbalied understanding.      Vision       Perception     Praxis      Cognition Arousal/Alertness: Awake/alert Behavior During Therapy: WFL for tasks assessed/performed Overall Cognitive Status: Within Functional Limits for tasks assessed                                          Exercises     Shoulder Instructions       General Comments       Pertinent Vitals/ Pain       Pain Assessment: 0-10 Pain Score: 5  Pain Location: right knee/stomach Pain Descriptors / Indicators: Discomfort Pain Intervention(s): Limited activity within patient's tolerance;Ice applied  Home Living                                          Prior Functioning/Environment              Frequency           Progress Toward Goals  OT Goals(current goals can now be found in the care plan section)  Progress towards OT goals: Goals met/education completed, patient discharged from OT (appropriate for DC)  Acute Rehab OT Goals Patient Stated Goal: to retire  and enjoy it. states his best buddies from work just both died at 72 yos OT Goal Formulation: With patient Time For Goal Achievement: 06/03/16 Potential to Achieve Goals: Good ADL Goals Pt Will Perform Tub/Shower Transfer: Tub transfer;with supervision;ambulating;rolling walker  Plan Discharge plan needs to be updated    Co-evaluation                 End of Session    OT Visit Diagnosis: Unsteadiness on feet (R26.81)   Activity Tolerance Other (comment) (pt limited by nausea/stomach discomfort)   Patient Left in bed;with call bell/phone within reach;with family/visitor present   Nurse Communication Other (comment) (need for DME)        Time: 0938-1000 OT Time Calculation (min): 22 min  Charges: OT General Charges $OT Visit: 1 Procedure OT Treatments $Self Care/Home Management : 8-22 mins  [facility], OT/L  [phone] 05/21/2016   Jeremy Bennett,Jeremy Bennett 05/21/2016, 10:25 AM

## 2016-09-25 NOTE — Pre-Procedure Instructions (Signed)
JOVANE FOUTZ  09/25/2016      [id number] - [place], [place] - [place] [address] [place] [address] Phone: [phone] Fax: [phone]    Your procedure is scheduled on Sept 11  Report to [facility] at [phone].  Call this number if you have problems the morning of surgery:  [phone]   Remember:  Do not eat food or drink liquids after midnight.  Take these medicines the morning of surgery with A SIP OF WATER Paroxetine (Paxil)  Stop taking aspirin, BC's, Goody's, herbal medications, Fish Oil, Ibuprofen, Advil, Motrin, Aleve, Vitamins, Naproxen   Do not wear jewelry, make-up or nail polish.  Do not wear lotions, powders, or perfumes, or deoderant.  Do not shave 48 hours prior to surgery.  Men may shave face and neck.  Do not bring valuables to the hospital.  [facility] is not responsible for any belongings or valuables.  Contacts, dentures or bridgework may not be worn into surgery.  Leave your suitcase in the car.  After surgery it may be brought to your room.  For patients admitted to the hospital, discharge time will be determined by your treatment team.  Patients discharged the day of surgery will not be allowed to drive home.    Special instructions:  [facility] - Preparing for Surgery  Before surgery, you can play an important role.  Because skin is not sterile, your skin needs to be as free of germs as possible.  You can reduce the number of germs on you skin by washing with CHG (chlorahexidine gluconate) soap before surgery.  CHG is an antiseptic cleaner which kills germs and bonds with the skin to continue killing germs even after washing.  Please DO NOT use if you have an allergy to CHG or antibacterial soaps.  If your skin becomes reddened/irritated stop using the CHG and inform your nurse when you arrive at Short Stay.  Do not shave (including legs and underarms) for at least 48  hours prior to the first CHG shower.  You may shave your face.  Please follow these instructions carefully:   1.  Shower with CHG Soap the night before surgery and the  morning of Surgery.  2.  If you choose to wash your hair, wash your hair first as usual with your  normal shampoo.  3.  After you shampoo, rinse your hair and body thoroughly to remove the Shampoo.  4.  Use CHG as you would any other liquid soap.  You can apply chg directly  to the skin and wash gently with scrungie or a clean washcloth.  5.  Apply the CHG Soap to your body ONLY FROM THE NECK DOWN.   Do not use on open wounds or open sores.  Avoid contact with your eyes,  ears, mouth and genitals (private parts).  Wash genitals (private parts) with your normal soap.  6.  Wash thoroughly, paying special attention to the area where your surgery will be performed.  7.  Thoroughly rinse your body with warm water from the neck down.  8.  DO NOT shower/wash with your normal soap after using and rinsing off  the CHG Soap.  9.  Pat yourself dry with a clean towel.            10.  Wear clean pajamas.            11.  Place clean sheets on your bed the night of your first shower and do not  sleep with pets.  Day of Surgery  Do not apply any lotions/deoderants the morning of surgery.  Please wear clean clothes to the hospital/surgery center.    Please read over the following fact sheets that you were given. Pain Booklet, Coughing and Deep Breathing, MRSA Information and Surgical Site Infection Prevention

## 2016-09-29 ENCOUNTER — Encounter (HOSPITAL_COMMUNITY)
Admission: RE | Admit: 2016-09-29 | Discharge: 2016-09-29 | Disposition: A | Discharge status: Home / Self Care | Payer: 59 | Source: Ambulatory Visit | Attending: Orthopaedic Surgery | Admitting: Orthopaedic Surgery

## 2016-09-29 ENCOUNTER — Encounter (HOSPITAL_COMMUNITY): Payer: Self-pay

## 2016-09-29 DIAGNOSIS — Z01818 Encounter for other preprocedural examination: Secondary | ICD-10-CM | POA: Insufficient documentation

## 2016-09-29 LAB — PROTIME-INR
INR: 1.08
Prothrombin Time: 13.9 seconds (ref 11.4–15.2)

## 2016-09-29 LAB — CBC WITH DIFFERENTIAL/PLATELET
Basophils Absolute: 0 10*3/uL (ref 0.0–0.1)
Basophils Relative: 1 %
Eosinophils Absolute: 0.3 10*3/uL (ref 0.0–0.7)
Eosinophils Relative: 4 %
HEMATOCRIT: 44.4 % (ref 39.0–52.0)
HEMOGLOBIN: 15.3 g/dL (ref 13.0–17.0)
LYMPHS ABS: 2.1 10*3/uL (ref 0.7–4.0)
Lymphocytes Relative: 33 %
MCH: 31.1 pg (ref 26.0–34.0)
MCHC: 34.5 g/dL (ref 30.0–36.0)
MCV: 90.2 fL (ref 78.0–100.0)
MONO ABS: 0.5 10*3/uL (ref 0.1–1.0)
MONOS PCT: 7 %
NEUTROS ABS: 3.5 10*3/uL (ref 1.7–7.7)
NEUTROS PCT: 55 %
Platelets: 159 10*3/uL (ref 150–400)
RBC: 4.92 MIL/uL (ref 4.22–5.81)
RDW: 13.1 % (ref 11.5–15.5)
WBC: 6.4 10*3/uL (ref 4.0–10.5)

## 2016-09-29 LAB — BASIC METABOLIC PANEL
ANION GAP: 9 (ref 5–15)
BUN: 13 mg/dL (ref 6–20)
CALCIUM: 9.2 mg/dL (ref 8.9–10.3)
CHLORIDE: 105 mmol/L (ref 101–111)
CO2: 26 mmol/L (ref 22–32)
CREATININE: 0.9 mg/dL (ref 0.61–1.24)
GFR calc Af Amer: >60 mL/min (ref >60)
GFR calc non Af Amer: >60 mL/min (ref >60)
GLUCOSE: 125 mg/dL — AB (ref 65–99)
Potassium: 3.9 mmol/L (ref 3.5–5.1)
SODIUM: 140 mmol/L (ref 135–145)

## 2016-09-29 LAB — URINALYSIS, ROUTINE W REFLEX MICROSCOPIC
Bilirubin Urine: NEGATIVE
Glucose, UA: NEGATIVE mg/dL
Hgb urine dipstick: NEGATIVE
KETONES UR: NEGATIVE mg/dL
LEUKOCYTES UA: NEGATIVE
NITRITE: NEGATIVE
PROTEIN: NEGATIVE mg/dL
Specific Gravity, Urine: 1.017 (ref 1.005–1.030)
pH: 5 (ref 5.0–8.0)

## 2016-09-29 LAB — SURGICAL PCR SCREEN
MRSA, PCR: NEGATIVE
STAPHYLOCOCCUS AUREUS: NEGATIVE

## 2016-09-29 LAB — TYPE AND SCREEN
ABO/RH(D): O POS
Antibody Screen: NEGATIVE

## 2016-09-29 LAB — APTT: APTT: 38 s — AB (ref 24–36)

## 2016-09-29 NOTE — Progress Notes (Signed)
PCP is Dr. Matthias Hughs Denies ever seeing a cardiologist. Denies ever having a card cath or echo. States he thinks he had a stress test many years ago maybe 2008. Denies nay chest pain, fever, or cough. CXR and EKG noted from 04-2016 Instructed to bring in his CPAP on the day of surgery. Voices understanding.

## 2016-10-05 MED ORDER — TRANEXAMIC ACID 1000 MG/10ML IV SOLN
2000.0000 mg | INTRAVENOUS | Status: DC
  Filled 2016-10-05: qty 20

## 2016-10-05 MED ORDER — DEXTROSE 5 % IV SOLN
3.0000 g | INTRAVENOUS | Status: AC
  Administered 2016-10-06: 3 g via INTRAVENOUS
  Filled 2016-10-05: qty 3

## 2016-10-05 NOTE — H&P (Signed)
TOTAL KNEE ADMISSION H&P  Patient is being admitted for left total knee arthroplasty.  Subjective:  Chief Complaint:left knee pain.  HPI: Jeremy Bennett, 61 y.o. male, has a history of pain and functional disability in the left knee due to arthritis and has failed non-surgical conservative treatments for greater than 12 weeks to includeNSAID's and/or analgesics, corticosteriod injections, viscosupplementation injections, flexibility and strengthening excercises, supervised PT with diminished ADL's post treatment, use of assistive devices, weight reduction as appropriate and activity modification.  Onset of symptoms was gradual, starting 5 years ago with gradually worsening course since that time. The patient noted no past surgery on the left knee(s).  Patient currently rates pain in the left knee(s) at 10 out of 10 with activity. Patient has night pain, worsening of pain with activity and weight bearing, pain that interferes with activities of daily living, crepitus and joint swelling.  Patient has evidence of subchondral cysts, subchondral sclerosis, periarticular osteophytes and joint space narrowing by imaging studies. There is no active infection.  Patient Active Problem List   Diagnosis Date Noted  . Primary localized osteoarthritis of right knee 05/19/2016  . Primary osteoarthritis of right knee 05/19/2016   Past Medical History:  Diagnosis Date  . ADD (attention deficit disorder)   . Arthritis   . Gout   . History of asbestosis   . Hypertension   . Sleep apnea    tested more than 5 yrs ago    Past Surgical History:  Procedure Laterality Date  . COLONOSCOPY    . FRACTURE SURGERY     right clavicle   1972  . KNEE ARTHROSCOPY     right  . 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   Procedure: TOTAL KNEE ARTHROPLASTY;  Surgeon: Melrose Nakayama, MD;  Location: [place];  Service: Orthopedics;  Laterality: Right;    No prescriptions prior to  admission.   Allergies  Allergen Reactions  . No Known Allergies     Social History  Substance Use Topics  . Smoking status: Never Smoker  . Smokeless tobacco: Never Used  . Alcohol use 0.6 oz/week    1 Cans of beer per week     Comment: in a week's time    No family history on file.   Review of Systems  Musculoskeletal: Positive for joint pain.       Left knee  All other systems reviewed and are negative.   Objective:  Physical Exam  Constitutional: He is oriented to person, place, and time. He appears well-developed and well-nourished.  HENT:  Head: Normocephalic and atraumatic.  Eyes: Pupils are equal, round, and reactive to light.  Neck: Normal range of motion.  Cardiovascular: Normal rate.   Respiratory: Effort normal.  GI: Soft.  Musculoskeletal:  Examination of the left knee shows range of motion from 0-115 of flexion.  Mild crepitation.  He has some tenderness palpation along his joint line.  His ligaments are stable.  His calf is soft and nontender.  He is neurovascularly intact distally.    Neurological: He is alert and oriented to person, place, and time.  Skin: Skin is warm and dry.  Psychiatric: He has a normal mood and affect. His behavior is normal. Judgment and thought content normal.    Vital signs in last 24 hours:    Labs:   Estimated body mass index is 38.2 kg/m as calculated from the following:   Height as of 09/29/16: 6\' 2"  (1.88 m).  Weight as of 09/29/16: 134.9 kg (297 lb 8 oz).   Imaging Review Plain radiographs demonstrate severe degenerative joint disease of the left knee(s). The overall alignment isneutral. The bone quality appears to be good for age and reported activity level.  Assessment/Plan:  End stage primary arthritis, left knee   The patient history, physical examination, clinical judgment of the provider and imaging studies are consistent with end stage degenerative joint disease of the left knee(s) and total knee  arthroplasty is deemed medically necessary. The treatment options including medical management, injection therapy arthroscopy and arthroplasty were discussed at length. The risks and benefits of total knee arthroplasty were presented and reviewed. The risks due to aseptic loosening, infection, stiffness, patella tracking problems, thromboembolic complications and other imponderables were discussed. The patient acknowledged the explanation, agreed to proceed with the plan and consent was signed. Patient is being admitted for inpatient treatment for surgery, pain control, PT, OT, prophylactic antibiotics, VTE prophylaxis, progressive ambulation and ADL's and discharge planning. The patient is planning to be discharged home with home health services

## 2016-10-06 ENCOUNTER — Inpatient Hospital Stay (HOSPITAL_COMMUNITY): Payer: 59 | Admitting: Anesthesiology

## 2016-10-06 ENCOUNTER — Inpatient Hospital Stay (HOSPITAL_COMMUNITY)
Admission: RE | Admit: 2016-10-06 | Discharge: 2016-10-08 | DRG: 470 | Disposition: A | Discharge status: Home Health | Payer: 59 | Source: Ambulatory Visit | Attending: Orthopaedic Surgery | Admitting: Orthopaedic Surgery

## 2016-10-06 ENCOUNTER — Encounter (HOSPITAL_COMMUNITY): Payer: Self-pay | Admitting: General Practice

## 2016-10-06 ENCOUNTER — Encounter (HOSPITAL_COMMUNITY)
Admission: RE | Disposition: A | Discharge status: Home Health | Payer: Self-pay | Source: Ambulatory Visit | Attending: Orthopaedic Surgery

## 2016-10-06 DIAGNOSIS — Z6837 Body mass index (BMI) 37.0-37.9, adult: Secondary | ICD-10-CM

## 2016-10-06 DIAGNOSIS — M1712 Unilateral primary osteoarthritis, left knee: Principal | ICD-10-CM | POA: Diagnosis present

## 2016-10-06 DIAGNOSIS — M25562 Pain in left knee: Secondary | ICD-10-CM | POA: Diagnosis present

## 2016-10-06 DIAGNOSIS — G4730 Sleep apnea, unspecified: Secondary | ICD-10-CM | POA: Diagnosis present

## 2016-10-06 DIAGNOSIS — Z23 Encounter for immunization: Secondary | ICD-10-CM | POA: Diagnosis not present

## 2016-10-06 DIAGNOSIS — I10 Essential (primary) hypertension: Secondary | ICD-10-CM | POA: Diagnosis present

## 2016-10-06 DIAGNOSIS — Z7982 Long term (current) use of aspirin: Secondary | ICD-10-CM

## 2016-10-06 DIAGNOSIS — Z96651 Presence of right artificial knee joint: Secondary | ICD-10-CM | POA: Diagnosis present

## 2016-10-06 DIAGNOSIS — F988 Other specified behavioral and emotional disorders with onset usually occurring in childhood and adolescence: Secondary | ICD-10-CM | POA: Diagnosis present

## 2016-10-06 DIAGNOSIS — M109 Gout, unspecified: Secondary | ICD-10-CM | POA: Diagnosis present

## 2016-10-06 HISTORY — PX: TOTAL KNEE ARTHROPLASTY: SHX125

## 2016-10-06 SURGERY — ARTHROPLASTY, KNEE, TOTAL
Anesthesia: Spinal | Site: Knee | Laterality: Left

## 2016-10-06 MED ORDER — ONDANSETRON HCL 4 MG PO TABS
4.0000 mg | ORAL_TABLET | Freq: Four times a day (QID) | ORAL | Status: DC | PRN
  Administered 2016-10-07: 4 mg via ORAL
  Filled 2016-10-06: qty 1

## 2016-10-06 MED ORDER — 0.9 % SODIUM CHLORIDE (POUR BTL) OPTIME
TOPICAL | Status: DC | PRN
  Administered 2016-10-06: 1000 mL

## 2016-10-06 MED ORDER — METHOCARBAMOL 500 MG PO TABS
ORAL_TABLET | ORAL | Status: AC
  Administered 2016-10-06: 500 mg via ORAL
  Filled 2016-10-06: qty 1

## 2016-10-06 MED ORDER — ASPIRIN EC 325 MG PO TBEC
325.0000 mg | DELAYED_RELEASE_TABLET | Freq: Two times a day (BID) | ORAL | Status: DC
  Administered 2016-10-07 – 2016-10-08 (×3): 325 mg via ORAL
  Filled 2016-10-06 (×3): qty 1

## 2016-10-06 MED ORDER — MIDAZOLAM HCL 2 MG/2ML IJ SOLN
INTRAMUSCULAR | Status: AC
  Filled 2016-10-06: qty 2

## 2016-10-06 MED ORDER — FENTANYL CITRATE (PF) 100 MCG/2ML IJ SOLN
INTRAMUSCULAR | Status: AC
  Filled 2016-10-06: qty 2

## 2016-10-06 MED ORDER — DEXTROSE 5 % IV SOLN
3.0000 g | Freq: Four times a day (QID) | INTRAVENOUS | Status: AC
  Administered 2016-10-06 – 2016-10-07 (×2): 3 g via INTRAVENOUS
  Filled 2016-10-06 (×2): qty 3000

## 2016-10-06 MED ORDER — FENTANYL CITRATE (PF) 100 MCG/2ML IJ SOLN
50.0000 ug | Freq: Once | INTRAMUSCULAR | Status: AC
  Administered 2016-10-06: 50 ug via INTRAVENOUS

## 2016-10-06 MED ORDER — ONDANSETRON HCL 4 MG/2ML IJ SOLN
4.0000 mg | Freq: Four times a day (QID) | INTRAMUSCULAR | Status: DC | PRN
  Administered 2016-10-08: 4 mg via INTRAVENOUS
  Filled 2016-10-06: qty 2

## 2016-10-06 MED ORDER — LACTATED RINGERS IV SOLN: INTRAVENOUS | Status: DC

## 2016-10-06 MED ORDER — METOCLOPRAMIDE HCL 5 MG PO TABS: 5.0000 mg | ORAL_TABLET | Freq: Three times a day (TID) | ORAL | Status: DC | PRN

## 2016-10-06 MED ORDER — LISINOPRIL-HYDROCHLOROTHIAZIDE 20-12.5 MG PO TABS: 1.0000 | ORAL_TABLET | Freq: Every day | ORAL | Status: DC

## 2016-10-06 MED ORDER — METOCLOPRAMIDE HCL 5 MG/ML IJ SOLN: 5.0000 mg | Freq: Three times a day (TID) | INTRAMUSCULAR | Status: DC | PRN

## 2016-10-06 MED ORDER — PAROXETINE HCL 20 MG PO TABS
20.0000 mg | ORAL_TABLET | Freq: Every morning | ORAL | Status: DC
  Administered 2016-10-07 – 2016-10-08 (×2): 20 mg via ORAL
  Filled 2016-10-06 (×2): qty 1

## 2016-10-06 MED ORDER — HYDROCHLOROTHIAZIDE 12.5 MG PO CAPS
12.5000 mg | ORAL_CAPSULE | Freq: Every day | ORAL | Status: DC
Start: 2016-10-06 — End: 2016-10-08
  Administered 2016-10-07 – 2016-10-08 (×3): 12.5 mg via ORAL
  Filled 2016-10-06 (×2): qty 1

## 2016-10-06 MED ORDER — DIPHENHYDRAMINE HCL 12.5 MG/5ML PO ELIX: 12.5000 mg | ORAL_SOLUTION | ORAL | Status: DC | PRN

## 2016-10-06 MED ORDER — SODIUM CHLORIDE 0.9 % IJ SOLN
INTRAMUSCULAR | Status: DC | PRN
  Administered 2016-10-06: 30 mL

## 2016-10-06 MED ORDER — LISINOPRIL 20 MG PO TABS
20.0000 mg | ORAL_TABLET | Freq: Every day | ORAL | Status: DC
  Administered 2016-10-07 – 2016-10-08 (×3): 20 mg via ORAL
  Filled 2016-10-06 (×2): qty 1

## 2016-10-06 MED ORDER — BISACODYL 5 MG PO TBEC: 5.0000 mg | DELAYED_RELEASE_TABLET | Freq: Every day | ORAL | Status: DC | PRN

## 2016-10-06 MED ORDER — OXYCODONE-ACETAMINOPHEN 5-325 MG PO TABS
1.0000 | ORAL_TABLET | ORAL | Status: DC | PRN
  Administered 2016-10-06 – 2016-10-08 (×10): 2 via ORAL
  Filled 2016-10-06 (×8): qty 2

## 2016-10-06 MED ORDER — PROPOFOL 500 MG/50ML IV EMUL
INTRAVENOUS | Status: DC | PRN
  Administered 2016-10-06: 100 ug/kg/min via INTRAVENOUS

## 2016-10-06 MED ORDER — MIDAZOLAM HCL 2 MG/2ML IJ SOLN
2.0000 mg | Freq: Once | INTRAMUSCULAR | Status: AC
  Administered 2016-10-06: 2 mg via INTRAVENOUS

## 2016-10-06 MED ORDER — TRANEXAMIC ACID 1000 MG/10ML IV SOLN
INTRAVENOUS | Status: AC | PRN
  Administered 2016-10-06: 2000 mg via TOPICAL

## 2016-10-06 MED ORDER — BUPIVACAINE-EPINEPHRINE (PF) 0.5% -1:200000 IJ SOLN
INTRAMUSCULAR | Status: DC | PRN
  Administered 2016-10-06: 20 mL via PERINEURAL

## 2016-10-06 MED ORDER — BUPIVACAINE LIPOSOME 1.3 % IJ SUSP
20.0000 mL | Freq: Once | INTRAMUSCULAR | Status: DC
  Filled 2016-10-06: qty 20

## 2016-10-06 MED ORDER — DOCUSATE SODIUM 100 MG PO CAPS
100.0000 mg | ORAL_CAPSULE | Freq: Two times a day (BID) | ORAL | Status: DC
  Administered 2016-10-06 – 2016-10-08 (×4): 100 mg via ORAL
  Filled 2016-10-06 (×4): qty 1

## 2016-10-06 MED ORDER — MENTHOL 3 MG MT LOZG: 1.0000 | LOZENGE | OROMUCOSAL | Status: DC | PRN

## 2016-10-06 MED ORDER — MELATONIN 5 MG PO CAPS: 5.0000 mg | ORAL_CAPSULE | Freq: Every evening | ORAL | Status: DC | PRN

## 2016-10-06 MED ORDER — PHENOL 1.4 % MT LIQD: 1.0000 | OROMUCOSAL | Status: DC | PRN

## 2016-10-06 MED ORDER — TRANEXAMIC ACID 1000 MG/10ML IV SOLN
1000.0000 mg | Freq: Once | INTRAVENOUS | Status: AC
  Administered 2016-10-06: 1000 mg via INTRAVENOUS
  Filled 2016-10-06 (×2): qty 10

## 2016-10-06 MED ORDER — HYDROMORPHONE HCL 1 MG/ML IJ SOLN
INTRAMUSCULAR | Status: AC
  Filled 2016-10-06: qty 1

## 2016-10-06 MED ORDER — HYDROMORPHONE HCL 1 MG/ML IJ SOLN
0.5000 mg | INTRAMUSCULAR | Status: DC | PRN
  Administered 2016-10-06 – 2016-10-08 (×10): 1 mg via INTRAVENOUS
  Filled 2016-10-06 (×10): qty 1

## 2016-10-06 MED ORDER — BUPIVACAINE LIPOSOME 1.3 % IJ SUSP
INTRAMUSCULAR | Status: DC | PRN
  Administered 2016-10-06: 20 mL

## 2016-10-06 MED ORDER — OXYCODONE-ACETAMINOPHEN 5-325 MG PO TABS
ORAL_TABLET | ORAL | Status: AC
  Administered 2016-10-06: 2 via ORAL
  Filled 2016-10-06: qty 2

## 2016-10-06 MED ORDER — PHENYLEPHRINE HCL 10 MG/ML IJ SOLN
INTRAVENOUS | Status: DC | PRN
  Administered 2016-10-06: 50 ug/min via INTRAVENOUS

## 2016-10-06 MED ORDER — DEXTROSE 5 % IV SOLN
500.0000 mg | Freq: Four times a day (QID) | INTRAVENOUS | Status: DC | PRN
  Filled 2016-10-06: qty 5

## 2016-10-06 MED ORDER — LACTATED RINGERS IV SOLN
INTRAVENOUS | Status: DC
  Administered 2016-10-06: 10 mL/h via INTRAVENOUS

## 2016-10-06 MED ORDER — BUPIVACAINE-EPINEPHRINE (PF) 0.5% -1:200000 IJ SOLN
INTRAMUSCULAR | Status: DC | PRN
  Administered 2016-10-06: 30 mL

## 2016-10-06 MED ORDER — SODIUM CHLORIDE 0.9 % IV SOLN
1000.0000 mg | INTRAVENOUS | Status: AC
  Administered 2016-10-06: 1000 mg via INTRAVENOUS
  Filled 2016-10-06: qty 10

## 2016-10-06 MED ORDER — SODIUM CHLORIDE 0.9 % IR SOLN
Status: DC | PRN
  Administered 2016-10-06: 3000 mL

## 2016-10-06 MED ORDER — METHOCARBAMOL 500 MG PO TABS
500.0000 mg | ORAL_TABLET | Freq: Four times a day (QID) | ORAL | Status: DC | PRN
  Administered 2016-10-06 – 2016-10-07 (×3): 500 mg via ORAL
  Filled 2016-10-06 (×2): qty 1

## 2016-10-06 MED ORDER — BUPIVACAINE-EPINEPHRINE (PF) 0.25% -1:200000 IJ SOLN
INTRAMUSCULAR | Status: AC
  Filled 2016-10-06: qty 30

## 2016-10-06 MED ORDER — ALUM & MAG HYDROXIDE-SIMETH 200-200-20 MG/5ML PO SUSP: 30.0000 mL | ORAL | Status: DC | PRN

## 2016-10-06 MED ORDER — LACTATED RINGERS IV SOLN
INTRAVENOUS | Status: DC | PRN
  Administered 2016-10-06 (×2): via INTRAVENOUS

## 2016-10-06 MED ORDER — ACETAMINOPHEN 650 MG RE SUPP: 650.0000 mg | Freq: Four times a day (QID) | RECTAL | Status: DC | PRN

## 2016-10-06 MED ORDER — ACETAMINOPHEN 325 MG PO TABS
650.0000 mg | ORAL_TABLET | Freq: Four times a day (QID) | ORAL | Status: DC | PRN
  Administered 2016-10-08: 650 mg via ORAL
  Filled 2016-10-06: qty 2

## 2016-10-06 SURGICAL SUPPLY — 55 items
BAG DECANTER FOR FLEXI CONT (MISCELLANEOUS) ×1 IMPLANT
BANDAGE ACE 6X5 VEL STRL LF (GAUZE/BANDAGES/DRESSINGS) ×1 IMPLANT
BANDAGE ESMARK 6X9 LF (GAUZE/BANDAGES/DRESSINGS) ×1 IMPLANT
BLADE SAGITTAL 25.0X1.19X90 (BLADE) ×1 IMPLANT
BLADE SAW SGTL 13.0X1.19X90.0M (BLADE) ×1 IMPLANT
BNDG CMPR 9X6 STRL LF SNTH (GAUZE/BANDAGES/DRESSINGS) ×1
BNDG CMPR MED 10X6 ELC LF (GAUZE/BANDAGES/DRESSINGS) ×1
BNDG ELASTIC 6X10 VLCR STRL LF (GAUZE/BANDAGES/DRESSINGS) ×2 IMPLANT
BNDG ESMARK 6X9 LF (GAUZE/BANDAGES/DRESSINGS) ×2
BOWL SMART MIX CTS (DISPOSABLE) ×2 IMPLANT
CAP KNEE TOTAL 3 SIGMA ×1 IMPLANT
CEMENT HV SMART SET (Cement) ×4 IMPLANT
CLSR STERI-STRIP ANTIMIC 1/2X4 (GAUZE/BANDAGES/DRESSINGS) ×1 IMPLANT
COVER SURGICAL LIGHT HANDLE (MISCELLANEOUS) ×2 IMPLANT
CUFF TOURNIQUET SINGLE 34IN LL (TOURNIQUET CUFF) ×2 IMPLANT
CUFF TOURNIQUET SINGLE 44IN (TOURNIQUET CUFF) IMPLANT
DECANTER SPIKE VIAL GLASS SM (MISCELLANEOUS) ×2 IMPLANT
DRAPE EXTREMITY T 121X128X90 (DRAPE) ×2 IMPLANT
DRAPE HALF SHEET 40X57 (DRAPES) ×4 IMPLANT
DRAPE U-SHAPE 47X51 STRL (DRAPES) ×2 IMPLANT
DRSG AQUACEL AG ADV 3.5X10 (GAUZE/BANDAGES/DRESSINGS) ×2 IMPLANT
DURAPREP 26ML APPLICATOR (WOUND CARE) ×2 IMPLANT
ELECT REM PT RETURN 9FT ADLT (ELECTROSURGICAL) ×2
ELECTRODE REM PT RTRN 9FT ADLT (ELECTROSURGICAL) ×1 IMPLANT
GLOVE BIO SURGEON STRL SZ8 (GLOVE) ×4 IMPLANT
GLOVE BIOGEL PI IND STRL 8 (GLOVE) ×2 IMPLANT
GLOVE BIOGEL PI INDICATOR 8 (GLOVE) ×2
GOWN STRL REUS W/ TWL LRG LVL3 (GOWN DISPOSABLE) ×1 IMPLANT
GOWN STRL REUS W/ TWL XL LVL3 (GOWN DISPOSABLE) ×2 IMPLANT
GOWN STRL REUS W/TWL LRG LVL3 (GOWN DISPOSABLE) ×2
GOWN STRL REUS W/TWL XL LVL3 (GOWN DISPOSABLE) ×4
HANDPIECE INTERPULSE COAX TIP (DISPOSABLE) ×2
HOOD PEEL AWAY FACE SHEILD DIS (HOOD) ×4 IMPLANT
IMMOBILIZER KNEE 22 UNIV (SOFTGOODS) ×2 IMPLANT
KIT BASIN OR (CUSTOM PROCEDURE TRAY) ×2 IMPLANT
KIT ROOM TURNOVER OR (KITS) ×2 IMPLANT
MANIFOLD NEPTUNE II (INSTRUMENTS) ×2 IMPLANT
NDL HYPO 21X1 ECLIPSE (NEEDLE) ×1 IMPLANT
NEEDLE HYPO 21X1 ECLIPSE (NEEDLE) ×2 IMPLANT
NS IRRIG 1000ML POUR BTL (IV SOLUTION) ×2 IMPLANT
PACK TOTAL JOINT (CUSTOM PROCEDURE TRAY) ×2 IMPLANT
PAD ARMBOARD 7.5X6 YLW CONV (MISCELLANEOUS) ×4 IMPLANT
SET HNDPC FAN SPRY TIP SCT (DISPOSABLE) ×1 IMPLANT
STRIP CLOSURE SKIN 1/2X4 (GAUZE/BANDAGES/DRESSINGS) ×2 IMPLANT
SUT VIC AB 0 CT1 27 (SUTURE) ×2
SUT VIC AB 0 CT1 27XBRD ANBCTR (SUTURE) ×1 IMPLANT
SUT VIC AB 2-0 CT1 27 (SUTURE) ×2
SUT VIC AB 2-0 CT1 TAPERPNT 27 (SUTURE) ×1 IMPLANT
SUT VIC AB 3-0 FS2 27 (SUTURE) ×2 IMPLANT
SUT VLOC 180 0 24IN GS25 (SUTURE) ×2 IMPLANT
SYR 50ML LL SCALE MARK (SYRINGE) ×2 IMPLANT
TOWEL OR 17X24 6PK STRL BLUE (TOWEL DISPOSABLE) ×2 IMPLANT
TOWEL OR 17X26 10 PK STRL BLUE (TOWEL DISPOSABLE) ×2 IMPLANT
TRAY CATH 16FR W/PLASTIC CATH (SET/KITS/TRAYS/PACK) IMPLANT
UPCHARGE REV TRAY MBT KNEE ×1 IMPLANT

## 2016-10-06 NOTE — Progress Notes (Signed)
Orthopedic Tech Progress Note Patient Details:  Jeremy Bennett [birth year] [id number]  CPM Left Knee CPM Left Knee: On Left Knee Flexion (Degrees): 90 Left Knee Extension (Degrees): 0   [name] 10/06/2016, 2:17 PM ohf not applied because pt's weight exceeds durability of frame; RN notified

## 2016-10-06 NOTE — Interval H&P Note (Signed)
History and Physical Interval Note:  10/06/2016 9:46 AM  Jeremy Bennett  has presented today for surgery, with the diagnosis of LEFT KNEE OSTEOARTHRITIS  The various methods of treatment have been discussed with the patient and family. After consideration of risks, benefits and other options for treatment, the patient has consented to  Procedure(s): TOTAL KNEE ARTHROPLASTY (Left) as a surgical intervention .  The patient's history has been reviewed, patient examined, no change in status, stable for surgery.  I have reviewed the patient's chart and labs.  Questions were answered to the patient's satisfaction.     Hale Chalfin G

## 2016-10-06 NOTE — Anesthesia Postprocedure Evaluation (Signed)
Anesthesia Post Note  Patient: Jeremy Bennett  Procedure(s) Performed: Procedure(s) (LRB): TOTAL KNEE ARTHROPLASTY (Left)     Patient location during evaluation: PACU Anesthesia Type: Spinal Level of consciousness: oriented and awake and alert Pain management: pain level controlled Vital Signs Assessment: post-procedure vital signs reviewed and stable Respiratory status: spontaneous breathing, respiratory function stable, patient connected to nasal cannula oxygen and nonlabored ventilation Cardiovascular status: blood pressure returned to baseline and stable Postop Assessment: no headache, no backache, no signs of nausea or vomiting, patient able to bend at knees and spinal receding Anesthetic complications: no    Last Vitals:  Vitals:   10/06/16 1447 10/06/16 1502  BP: 114/74 109/77  Pulse: (!) 54 62  Resp: 10 19  Temp:    SpO2: 99% 94%    Last Pain:  Vitals:   10/06/16 1420  TempSrc:   PainSc: 0-No pain                 Amore Grater A.

## 2016-10-06 NOTE — Anesthesia Procedure Notes (Signed)
Procedure Name: MAC Date/Time: 10/06/2016 11:00 AM Performed by: Eligha Bridegroom Pre-anesthesia Checklist: Patient identified, Emergency Drugs available, Suction available, Patient being monitored and Timeout performed Patient Re-evaluated:Patient Re-evaluated prior to induction Oxygen Delivery Method: Nasal cannula Preoxygenation: Pre-oxygenation with 100% oxygen Induction Type: IV induction

## 2016-10-06 NOTE — Transfer of Care (Signed)
Immediate Anesthesia Transfer of Care Note  Patient: Jeremy Bennett  Procedure(s) Performed: Procedure(s): TOTAL KNEE ARTHROPLASTY (Left)  Patient Location: PACU  Anesthesia Type:Spinal  Level of Consciousness: awake and alert   Airway & Oxygen Therapy: Patient Spontanous Breathing and Patient connected to nasal cannula oxygen  Post-op Assessment: Report given to RN and Post -op Vital signs reviewed and stable  Post vital signs: Reviewed and stable  Last Vitals:  Vitals:   10/06/16 0845 10/06/16 0916  BP: 105/76 106/73  Pulse: 70 73  Resp: 13 15  Temp:    SpO2: 99% 97%    Last Pain:  Vitals:   10/06/16 0845  TempSrc:   PainSc: 0-No pain      Patients Stated Pain Goal: 2 (53/79/43 2761)  Complications: No apparent anesthesia complications

## 2016-10-06 NOTE — Progress Notes (Signed)
Patient has home cpap and places himself on/off. RT informed patient to call if assistance is needed.

## 2016-10-06 NOTE — Anesthesia Procedure Notes (Signed)
Anesthesia Regional Block: Adductor canal block   Pre-Anesthetic Checklist: ,, timeout performed, Correct Patient, Correct Site, Correct Laterality, Correct Procedure, Correct Position, site marked, Risks and benefits discussed, pre-op evaluation,  At surgeon's request and post-op pain management  Laterality: Left  Prep: chloraprep       Needles:   Needle Type: Echogenic Needle     Needle Length: 9cm  Needle Gauge: 21     Additional Needles:   Procedures: ultrasound guided,,,,,,,,  Narrative:  Start time: 10/06/2016 8:50 AM End time: 10/06/2016 8:54 AM Injection made incrementally with aspirations every 5 mL. Anesthesiologist: Lyndle Herrlich

## 2016-10-06 NOTE — Op Note (Signed)
PREOP DIAGNOSIS: DJD LEFT KNEE POSTOP DIAGNOSIS:  same PROCEDURE: LEFT TKR ANESTHESIA: Spinal and block and MAC ATTENDING SURGEON: Alayziah Tangeman G ASSISTANTLoni Dolly PA  INDICATIONS FOR PROCEDURE: Jeremy Bennett is a 61 y.o. male who has struggled for a long time with pain due to degenerative arthritis of the left knee.  The patient has failed many conservative non-operative measures and at this point has pain which limits the ability to sleep and walk.  The patient is offered total knee replacement.  Informed operative consent was obtained after discussion of possible risks of anesthesia, infection, neurovascular injury, DVT, and death.  The importance of the post-operative rehabilitation protocol to optimize result was stressed extensively with the patient. He is many months out form TKR on the opposite side which has worked out very well.   SUMMARY OF FINDINGS AND PROCEDURE:  Jeremy Bennett was taken to the operative suite where under the above anesthesia a left knee replacement was performed.  There were advanced degenerative changes and the bone quality was excellent.  We used the DePuyLCS system and placed size large femur, 6 MBT revision tibia, 41 mm all polyethylene patella, and a size 12.5 mm spacer.  Loni Dolly PA-C assisted throughout and was invaluable to the completion of the case in that he helped retract and maintain exposure while I placed the components.  He also helped close thereby minimizing OR time.  The patient was admitted for appropriate post-op care to include perioperative antibiotics and mechanical and pharmacologic measures for DVT prophylaxis.  DESCRIPTION OF PROCEDURE:  Jeremy Bennett was taken to the operative suite where the above anesthesia was applied.  The patient was positioned supine and prepped and draped in normal sterile fashion.  An appropriate time out was performed.  After the administration of kefzol pre-op antibiotic the leg was elevated and exsanguinated  and a tourniquet inflated.  A standard longitudinal incision was made on the anterior knee.  Dissection was carried down to the extensor mechanism.  All appropriate anti-infective measures were used including the pre-operative antibiotic, betadine impregnated drape, and closed hooded exhaust systems for each member of the surgical team.  A medial parapatellar incision was made in the extensor mechanism and the knee cap flipped and the knee flexed.  Some residual meniscal tissues were removed along with any remaining ACL/PCL tissue.  A guide was placed on the tibia and a flat cut was made on it's superior surface.  An intramedullary guide was placed in the femur and was utilized to make anterior and posterior cuts creating an appropriate flexion gap.  A second intramedullary guide was placed in the femur to make a distal cut properly balancing the knee with an extension gap equal to the flexion gap.  The three bones sized to the above mentioned sizes and the appropriate guides were placed and utilized.  A trial reduction was done and the knee easily came to full extension and the patella tracked well on flexion.  The trial components were removed and all bones were cleaned with pulsatile lavage and then dried thoroughly.  Cement was mixed and was pressurized onto the bones followed by placement of the aforementioned components.  Excess cement was trimmed and pressure was held on the components until the cement had hardened.  The tourniquet was deflated and a small amount of bleeding was controlled with cautery and pressure.  The knee was irrigated thoroughly.  The extensor mechanism was re-approximated with V-loc suture in running fashion.  The knee  was flexed and the repair was solid.  The subcutaneous tissues were re-approximated with #0 and #2-0 vicryl and the skin closed with a subcuticular stitch and steristrips.  A sterile dressing was applied.  Intraoperative fluids, EBL, and tourniquet time can be obtained  from anesthesia records.  DISPOSITION:  The patient was taken to recovery room in stable condition and admitted for appropriate post-op care to include peri-operative antibiotic and DVT prophylaxis with mechanical and pharmacologic measures.  Shamanda Len G 10/06/2016, 12:42 PM

## 2016-10-06 NOTE — Anesthesia Preprocedure Evaluation (Signed)
Anesthesia Evaluation  Patient identified by MRN, date of birth, ID band Patient awake    Reviewed: Allergy & Precautions, H&P , NPO status , Patient's Chart, lab work & pertinent test results  Airway Mallampati: III  TM Distance: >3 FB Neck ROM: Full    Dental no notable dental hx. (+) Teeth Intact, Dental Advisory Given   Pulmonary sleep apnea and Continuous Positive Airway Pressure Ventilation ,    Pulmonary exam normal breath sounds clear to auscultation       Cardiovascular Exercise Tolerance: Good hypertension, Pt. on medications  Rhythm:Regular Rate:Normal     Neuro/Psych negative neurological ROS  negative psych ROS   GI/Hepatic negative GI ROS, Neg liver ROS,   Endo/Other  Morbid obesity  Renal/GU negative Renal ROS  negative genitourinary   Musculoskeletal  (+) Arthritis , Osteoarthritis,    Abdominal   Peds  Hematology negative hematology ROS (+)   Anesthesia Other Findings   Reproductive/Obstetrics negative OB ROS                             Anesthesia Physical  Anesthesia Plan  ASA: III  Anesthesia Plan: Spinal   Post-op Pain Management:  Regional for Post-op pain   Induction: Intravenous  PONV Risk Score and Plan: 1 and Ondansetron and Dexamethasone  Airway Management Planned: Simple Face Mask  Additional Equipment:   Intra-op Plan:   Post-operative Plan:   Informed Consent: I have reviewed the patients History and Physical, chart, labs and discussed the procedure including the risks, benefits and alternatives for the proposed anesthesia with the patient or authorized representative who has indicated his/her understanding and acceptance.   Dental advisory given  Plan Discussed with: CRNA  Anesthesia Plan Comments:         Anesthesia Quick Evaluation

## 2016-10-06 NOTE — Anesthesia Procedure Notes (Signed)
Spinal  Patient location during procedure: OR Staffing Anesthesiologist: Lyndle Herrlich Spinal Block Patient position: sitting Prep: DuraPrep Patient monitoring: heart rate, blood pressure and continuous pulse ox Approach: right paramedian Location: L3-4 Injection technique: single-shot Needle Needle type: Sprotte  Needle gauge: 24 G Needle length: 9 cm Assessment Sensory level: T4 Additional Notes Spinal Dosage in OR  .75% Bupivicaine ml       1.9 LLD x 3 min

## 2016-10-07 ENCOUNTER — Encounter (HOSPITAL_COMMUNITY): Payer: Self-pay | Admitting: Orthopaedic Surgery

## 2016-10-07 MED ORDER — INFLUENZA VAC SPLIT QUAD 0.5 ML IM SUSY
0.5000 mL | PREFILLED_SYRINGE | INTRAMUSCULAR | Status: AC
  Administered 2016-10-08: 0.5 mL via INTRAMUSCULAR
  Filled 2016-10-07: qty 0.5

## 2016-10-07 MED ORDER — DEXTROSE 5 % IV SOLN: 500.0000 mg | Freq: Four times a day (QID) | INTRAVENOUS | Status: DC | PRN

## 2016-10-07 MED ORDER — METHOCARBAMOL 750 MG PO TABS
750.0000 mg | ORAL_TABLET | Freq: Four times a day (QID) | ORAL | Status: DC | PRN
  Administered 2016-10-07 – 2016-10-08 (×4): 750 mg via ORAL
  Filled 2016-10-07 (×4): qty 1

## 2016-10-07 NOTE — Progress Notes (Signed)
PT Cancellation Note  Patient Details Name: Jeremy Bennett MRN: [id number] DOB: [birth year]   Cancelled Treatment:    Reason Eval/Treat Not Completed: Other (comment). Pt eating lunch and visiting with family, kindly requests PT come back for treatment later. Will follow-up this afternoon as time allows.  Mabeline Caras, PT, DPT Acute Rehab Services  Pager: [place] 10/07/2016, 1:03 PM

## 2016-10-07 NOTE — Progress Notes (Signed)
Pt stated that he can place himself on cpap. RT will continue to monitor pt.

## 2016-10-07 NOTE — Care Management Note (Signed)
Case Management Note  Patient Details  Name: Jeremy Bennett MRN: [id number] Date of Birth: October 28, 1955  Subjective/Objective:                 Patient admitted for L Knee. Has DME needed at home from R knee sx in April. He states he used [facility] in the past for [facility] and would like to use them again, referral placed to [facility]. CPM machine to be delivered to home after DC.     Action/Plan:   Expected Discharge Date:  10/09/16               Expected Discharge Plan:  [place]  In-House Referral:     Discharge planning Services  CM Consult  Post Acute Care Choice:  Home Health Choice offered to:  Patient  DME Arranged:    DME Agency:     HH Arranged:  PT [place]:  [place]  Status of Service:  Completed, signed off  If discussed at Black of Stay Meetings, dates discussed:    Additional Comments:  Carles Collet, RN 10/07/2016, 1:33 PM

## 2016-10-07 NOTE — Evaluation (Signed)
Physical Therapy Evaluation Patient Details Name: Jeremy Bennett MRN: [id number] DOB: 07-29-1955 Today's Date: 10/07/2016   History of Present Illness  Pt is a 61 y.o. male now s/p elective L TKA. 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     Clinical Impression  Pt presents with pain and an overall decrease in functional mobility secondary to above. PTA, pt indep and lives at home with wife; will have family assist available as needed at d/c. Educ on precautions, positioning, and importance of mobility. Today, pt mod indep with bed mobility and able to amb with RW and min guard; further mobility limited by pain. Pt would benefit from continued acute PT services to maximize functional mobility and independence prior to d/c with HHPT.     Follow Up Recommendations DC plan and follow up therapy as arranged by surgeon;Home health PT    Equipment Recommendations  None recommended by PT (Owns necessary DME)    Recommendations for Other Services       Precautions / Restrictions Precautions Precautions: Knee Precaution Booklet Issued: No Precaution Comments: Verbally reviewed precautions Restrictions Weight Bearing Restrictions: Yes LLE Weight Bearing: Weight bearing as tolerated      Mobility  Bed Mobility Overal bed mobility: Needs Assistance Bed Mobility: Supine to Sit     Supine to sit: Modified independent (Device/Increase time)     General bed mobility comments: Sup<>sit x2 mod indep with use of bed rails, requiring increased time and effort  Transfers Overall transfer level: Needs assistance Equipment used: Rolling walker (2 wheeled) Transfers: Sit to/from Stand Sit to Stand: Min guard         General transfer comment: Min guard for balances with cues for hand placement  Ambulation/Gait Ambulation/Gait assistance: Min guard Ambulation Distance (Feet): 100 Feet Assistive device: Rolling walker (2 wheeled) Gait  Pattern/deviations: Step-to pattern;Step-through pattern;Decreased weight shift to left;Antalgic Gait velocity: Decreased Gait velocity interpretation: <1.8 ft/sec, indicative of risk for recurrent falls General Gait Details: Slow, antalgic gait with initial step-to pattern, able to progress to step-through heel-to-toe pattern with cues for technique  Stairs            Wheelchair Mobility    Modified Rankin (Stroke Patients Only)       Balance Overall balance assessment: Needs assistance Sitting-balance support: No upper extremity supported;Feet supported;Feet unsupported Sitting balance-Leahy Scale: Good     Standing balance support: No upper extremity supported;Bilateral upper extremity supported;During functional activity Standing balance-Leahy Scale: Fair Standing balance comment: Able to static stand with no AD support                             Pertinent Vitals/Pain Pain Assessment: 0-10 Pain Score: 6  Pain Descriptors / Indicators: Aching;Discomfort;Sore Pain Intervention(s): Limited activity within patient's tolerance;Monitored during session    [place] expects to be discharged to:: Private residence Living Arrangements: Spouse/significant other Available Help at Discharge: Family;Available 24 hours/day Type of Home: House Home Access: Stairs to enter Entrance Stairs-Rails: None Entrance Stairs-Number of Steps: 2 Home Layout: One level Home Equipment: Walker - 2 wheels;Cane - single point;Crutches;Hand held shower head;Shower seat;Bedside commode      Prior Function Level of Independence: Independent               Hand Dominance   Dominant Hand: Right    Extremity/Trunk Assessment   Upper Extremity Assessment Upper Extremity Assessment: Overall WFL for tasks assessed  Lower Extremity Assessment Lower Extremity Assessment: LLE deficits/detail LLE Deficits / Details: L hip flexion grossly 4/5 LLE: Unable to  fully assess due to pain       Communication   Communication: No difficulties  Cognition Arousal/Alertness: Awake/alert Behavior During Therapy: WFL for tasks assessed/performed Overall Cognitive Status: Within Functional Limits for tasks assessed                                        General Comments General comments (skin integrity, edema, etc.): SpO2 >90% with amb    Exercises     Assessment/Plan    PT Assessment Patient needs continued PT services  PT Problem List Decreased strength;Decreased mobility;Decreased range of motion;Decreased activity tolerance;Decreased balance;Decreased knowledge of use of DME;Decreased knowledge of precautions;Pain       PT Treatment Interventions DME instruction;Gait training;Stair training;Functional mobility training;Balance training;Therapeutic exercise;Therapeutic activities;Patient/family education    PT Goals (Current goals can be found in the Care Plan section)  Acute Rehab PT Goals Patient Stated Goal: Return home PT Goal Formulation: With patient Time For Goal Achievement: 10/21/16 Potential to Achieve Goals: Good    Frequency 7X/week   Barriers to discharge        Co-evaluation               AM-PAC PT "6 Clicks" Daily Activity  Outcome Measure Difficulty turning over in bed (including adjusting bedclothes, sheets and blankets)?: A Little Difficulty moving from lying on back to sitting on the side of the bed? : A Little Difficulty sitting down on and standing up from a chair with arms (e.g., wheelchair, bedside commode, etc,.)?: A Little Help needed moving to and from a bed to chair (including a wheelchair)?: A Little Help needed walking in hospital room?: A Little Help needed climbing 3-5 steps with a railing? : A Little 6 Click Score: 18    End of Session Equipment Utilized During Treatment: Gait belt Activity Tolerance: Patient tolerated treatment well;Patient limited by pain Patient left: in  bed;with call bell/phone within reach;with family/visitor present Nurse Communication: Mobility status PT Visit Diagnosis: Other abnormalities of gait and mobility (R26.89);Pain Pain - Right/Left: Left Pain - part of body: Knee    Time: 9983-3825 PT Time Calculation (min) (ACUTE ONLY): 24 min   Charges:   PT Evaluation $PT Eval Moderate Complexity: 1 Mod PT Treatments $Gait Training: 8-22 mins   PT G Codes:       Mabeline Caras, PT, DPT Acute Rehab Services  Pager: [place] 10/07/2016, 8:22 AM

## 2016-10-07 NOTE — Progress Notes (Signed)
Subjective: 1 Day Post-Op Procedure(s) (LRB): TOTAL KNEE ARTHROPLASTY (Left)  Activity level:  wbat Diet tolerance:  ok Voiding:  ok Patient reports pain as mild and moderate.    Objective: Vital signs in last 24 hours: Temp:  [97.8 F (36.6 C)-98.2 F (36.8 C)] 98 F (36.7 C) (09/12 0550) Pulse Rate:  [54-81] 60 (09/12 0550) Resp:  [10-19] 18 (09/12 0550) BP: (97-153)/(42-92) 142/65 (09/12 0550) SpO2:  [94 %-100 %] 97 % (09/12 0550) Weight:  [133.8 kg (295 lb)] 133.8 kg (295 lb) (09/11 0809)  Labs: No results for input(s): HGB in the last 72 hours. No results for input(s): WBC, RBC, HCT, PLT in the last 72 hours. No results for input(s): NA, K, CL, CO2, BUN, CREATININE, GLUCOSE, CALCIUM in the last 72 hours. No results for input(s): LABPT, INR in the last 72 hours.  Physical Exam:  Neurologically intact ABD soft Neurovascular intact Sensation intact distally Intact pulses distally Dorsiflexion/Plantar flexion intact Incision: dressing C/D/I and no drainage No cellulitis present Compartment soft  Assessment/Plan:  1 Day Post-Op Procedure(s) (LRB): TOTAL KNEE ARTHROPLASTY (Left) Advance diet Up with therapy D/C IV fluids Plan for discharge tomorrow Discharge home with home health if doing well and cleared by PT. I increased robaxin from 500 to 750mg . Continue on ASA 325mg  BID x 2 weeks post op. Follow up in office 2 weeks post op.  Jeremy Bennett, Larwance Sachs 10/07/2016, 7:51 AM

## 2016-10-07 NOTE — Evaluation (Signed)
Occupational Therapy Evaluation Patient Details Name: Jeremy Bennett MRN: [id number] DOB: 1955/07/18 Today's Date: 10/07/2016    History of Present Illness Pt is a 61 y.o. male now s/p elective L TKA. 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    Clinical Impression   This 61 y/o M presents with the above. At baseline Pt is mod independent with functional mobility using SPC, independent with ADL completion. Pt completed room level functional mobility at RW level with MinGuard assist, currently requires MaxA for LB ADLs. Pt will return home with family who are available to assist with ADLs PRN. Will continue to follow acutely to progress Pt's safety and independence with ADLs and functional mobility prior to return home.     Follow Up Recommendations  DC plan and follow up therapy as arranged by surgeon;Supervision/Assistance - 24 hour    Equipment Recommendations  None recommended by OT;Other (comment) (Pt has needed DME)           Precautions / Restrictions Precautions Precautions: Knee Precaution Comments: Verbally reviewed precautions Restrictions Weight Bearing Restrictions: Yes LLE Weight Bearing: Weight bearing as tolerated      Mobility Bed Mobility Overal bed mobility: Needs Assistance Bed Mobility: Supine to Sit     Supine to sit: Modified independent (Device/Increase time)     General bed mobility comments: Sup<>sit x2 mod indep, requiring increased time and effort  Transfers Overall transfer level: Needs assistance Equipment used: Rolling walker (2 wheeled) Transfers: Sit to/from Stand Sit to Stand: Min guard         General transfer comment: Min guard for safety with cues for hand placement    Balance Overall balance assessment: Needs assistance Sitting-balance support: No upper extremity supported;Feet supported;Feet unsupported Sitting balance-Leahy Scale: Good     Standing balance support: No upper extremity  supported;Bilateral upper extremity supported;During functional activity Standing balance-Leahy Scale: Fair Standing balance comment: Able to static stand to wash hands standing at sink with MinGuard for safety                            ADL either performed or assessed with clinical judgement   ADL Overall ADL's : Needs assistance/impaired Eating/Feeding: Independent;Sitting   Grooming: Wash/dry hands;Min guard;Standing   Upper Body Bathing: Min guard;Sitting   Lower Body Bathing: Minimal assistance;Sit to/from stand   Upper Body Dressing : Min guard;Sitting   Lower Body Dressing: Moderate assistance;Sit to/from stand   Toilet Transfer: Min guard;Ambulation;Comfort height toilet;Grab bars;RW   Toileting- Water quality scientist and Hygiene: Min guard;Sit to/from stand       Functional mobility during ADLs: Min guard;Rolling walker General ADL Comments: educated on compensatory techniques for completing ADLs                         Pertinent Vitals/Pain Pain Assessment: Faces Faces Pain Scale: Hurts a little bit Pain Descriptors / Indicators: Aching;Discomfort;Sore Pain Intervention(s): Ice applied;Repositioned;Monitored during session     Hand Dominance Right   Extremity/Trunk Assessment Upper Extremity Assessment Upper Extremity Assessment: Overall WFL for tasks assessed   Lower Extremity Assessment Lower Extremity Assessment: Defer to PT evaluation   Cervical / Trunk Assessment Cervical / Trunk Assessment: Normal   Communication Communication Communication: No difficulties   Cognition Arousal/Alertness: Awake/alert Behavior During Therapy: WFL for tasks assessed/performed Overall Cognitive Status: Within Functional Limits for tasks assessed  Home Living Family/patient expects to be discharged to:: Private residence Living Arrangements: Spouse/significant  other Available Help at Discharge: Family;Available 24 hours/day Type of Home: House Home Access: Stairs to enter [employer] of Steps: 2 Entrance Stairs-Rails: None Home Layout: One level     Bathroom Shower/Tub: Teacher, early years/pre: Handicapped height     Home Equipment: Environmental consultant - 2 wheels;Cane - single point;Crutches;Hand held shower head;Shower seat;Bedside commode   Additional Comments: used a cane at baseline      Prior Functioning/Environment Level of Independence: Independent with assistive device(s)                 OT Problem List: Decreased strength;Decreased activity tolerance;Decreased range of motion;Decreased knowledge of use of DME or AE;Decreased knowledge of precautions      OT Treatment/Interventions: Self-care/ADL training;DME and/or AE instruction;Therapeutic activities;Balance training;Therapeutic exercise;Energy conservation;Patient/family education    OT Goals(Current goals can be found in the care plan section) Acute Rehab OT Goals Patient Stated Goal: Return home OT Goal Formulation: With patient Time For Goal Achievement: 10/21/16 Potential to Achieve Goals: Good  OT Frequency: Min 2X/week                             AM-PAC PT "6 Clicks" Daily Activity     Outcome Measure Help from another person eating meals?: None Help from another person taking care of personal grooming?: A Little Help from another person toileting, which includes using toliet, bedpan, or urinal?: A Little Help from another person bathing (including washing, rinsing, drying)?: A Little Help from another person to put on and taking off regular upper body clothing?: None Help from another person to put on and taking off regular lower body clothing?: A Lot 6 Click Score: 19   End of Session Equipment Utilized During Treatment: Gait belt;Rolling walker  Activity Tolerance: Patient tolerated treatment well Patient left: in bed;with call  bell/phone within reach;with family/visitor present  OT Visit Diagnosis: Other abnormalities of gait and mobility (R26.89)                Time: 3825-0539 OT Time Calculation (min): 25 min Charges:  OT General Charges $OT Visit: 1 Visit OT Evaluation $OT Eval Low Complexity: 1 Low G-Codes:     Lou Cal, OT Pager [phone] 10/07/2016   Raymondo Band 10/07/2016, 12:59 PM

## 2016-10-07 NOTE — Progress Notes (Signed)
Physical Therapy Treatment Patient Details Name: Jeremy Bennett MRN: [id number] DOB: 1955-05-14 Today's Date: 10/07/2016    History of Present Illness Pt is a 61 y.o. male now s/p elective L TKA. 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    PT Comments    Pt limited by nausea this session (RN aware), but willing to perform seated and supine therex. Educ on positioning, CPM, and importance of mobility. Declined CPM secondary to nausea, but encouraged to use this evening as able. Will plan to work on gait and stair training tomorrow.   Follow Up Recommendations  DC plan and follow up therapy as arranged by surgeon;Home health PT     Equipment Recommendations  None recommended by PT (owns necessary DME)    Recommendations for Other Services       Precautions / Restrictions Precautions Precautions: Knee Precaution Booklet Issued: No Precaution Comments: Verbally reviewed precautions Restrictions Weight Bearing Restrictions: Yes LLE Weight Bearing: Weight bearing as tolerated    Mobility  Bed Mobility Overal bed mobility: Needs Assistance Bed Mobility: Supine to Sit;Sit to Supine     Supine to sit: Modified independent (Device/Increase time);HOB elevated Sit to supine: Modified independent (Device/Increase time);HOB elevated   General bed mobility comments: Mod indep to sit EOB; pt reports increased nausea after receiving dilaudid, but willing to perform seated therex. Returned to supine mod indep  Transfers Overall transfer level: Needs assistance Equipment used: Rolling walker (2 wheeled) Transfers: Sit to/from Stand Sit to Stand: Min guard         General transfer comment: Increased nausea with standing, and pt immediately returned to sitting with min guard  Ambulation/Gait             General Gait Details: Unable secondary to nausea   Stairs            Wheelchair Mobility    Modified Rankin (Stroke Patients  Only)       Balance Overall balance assessment: Needs assistance Sitting-balance support: No upper extremity supported;Feet supported;Feet unsupported Sitting balance-Leahy Scale: Good                                      Cognition Arousal/Alertness: Awake/alert Behavior During Therapy: WFL for tasks assessed/performed Overall Cognitive Status: Within Functional Limits for tasks assessed                                        Exercises Total Joint Exercises Ankle Circles/Pumps: AROM;Both;10 reps Straight Leg Raises: AROM;Left;20 reps Long Arc Quad: AROM;Left;10 reps    General Comments        Pertinent Vitals/Pain Pain Assessment: Faces Pain Score: 7  Pain Location: L knee Pain Descriptors / Indicators: Aching;Discomfort;Sore Pain Intervention(s): Limited activity within patient's tolerance;Monitored during session    Home Living                      Prior Function            PT Goals (current goals can now be found in the care plan section) Acute Rehab PT Goals Patient Stated Goal: Return home PT Goal Formulation: With patient Time For Goal Achievement: 10/21/16 Potential to Achieve Goals: Good Progress towards PT goals: Progressing toward goals    Frequency    7X/week  PT Plan Current plan remains appropriate    Co-evaluation              AM-PAC PT "6 Clicks" Daily Activity  Outcome Measure  Difficulty turning over in bed (including adjusting bedclothes, sheets and blankets)?: None Difficulty moving from lying on back to sitting on the side of the bed? : None Difficulty sitting down on and standing up from a chair with arms (e.g., wheelchair, bedside commode, etc,.)?: A Little Help needed moving to and from a bed to chair (including a wheelchair)?: A Little Help needed walking in hospital room?: A Little Help needed climbing 3-5 steps with a railing? : A Little 6 Click Score: 20    End of  Session Equipment Utilized During Treatment: Gait belt Activity Tolerance: Patient tolerated treatment well;Other (comment) (limited by nausea) Patient left: in bed;with call bell/phone within reach;with family/visitor present Nurse Communication: Mobility status PT Visit Diagnosis: Other abnormalities of gait and mobility (R26.89);Pain Pain - Right/Left: Left Pain - part of body: Knee     Time: 8315-1761 PT Time Calculation (min) (ACUTE ONLY): 12 min  Charges:  $Therapeutic Exercise: 8-22 mins                    G Codes:      Mabeline Caras, PT, DPT Acute Rehab Services  Pager: [place] 10/07/2016, 5:23 PM

## 2016-10-08 MED ORDER — ASPIRIN 325 MG PO TBEC: 325.0000 mg | DELAYED_RELEASE_TABLET | Freq: Two times a day (BID) | ORAL | 0 refills | Status: DC

## 2016-10-08 MED ORDER — METHOCARBAMOL 750 MG PO TABS: 750.0000 mg | ORAL_TABLET | Freq: Four times a day (QID) | ORAL | 0 refills | Status: DC | PRN

## 2016-10-08 MED ORDER — BISACODYL 5 MG PO TBEC: 5.0000 mg | DELAYED_RELEASE_TABLET | Freq: Every day | ORAL | 0 refills | Status: DC | PRN

## 2016-10-08 MED ORDER — OXYCODONE-ACETAMINOPHEN 5-325 MG PO TABS: 1.0000 | ORAL_TABLET | ORAL | 0 refills | Status: DC | PRN

## 2016-10-08 MED ORDER — DOCUSATE SODIUM 100 MG PO CAPS: 100.0000 mg | ORAL_CAPSULE | Freq: Two times a day (BID) | ORAL | 0 refills | Status: DC

## 2016-10-08 NOTE — Discharge Summary (Signed)
Patient ID: Jeremy Bennett MRN: [id number] DOB/AGE: Dec 10, 1955 61 y.o.  Admit date: 10/06/2016 Discharge date: 10/08/2016  Admission Diagnoses:  Principal Problem:   Primary localized osteoarthritis of left knee Active Problems:   Primary osteoarthritis of left knee   Discharge Diagnoses:  Same  Past Medical History:  Diagnosis Date  . ADD (attention deficit disorder)   . Arthritis   . Gout   . History of asbestosis   . Hypertension   . Sleep apnea    tested more than 5 yrs ago    Surgeries: Procedure(s): TOTAL KNEE ARTHROPLASTY on 10/06/2016   Consultants:   Discharged Condition: Improved  Hospital Course: Jeremy Bennett is an 61 y.o. male who was admitted 10/06/2016 for operative treatment ofPrimary localized osteoarthritis of left knee. Patient has severe unremitting pain that affects sleep, daily activities, and work/hobbies. After pre-op clearance the patient was taken to the operating room on 10/06/2016 and underwent  Procedure(s): TOTAL KNEE ARTHROPLASTY.    Patient was given perioperative antibiotics: Anti-infectives    Start     Dose/Rate Route Frequency Ordered Stop   10/06/16 1900  ceFAZolin (ANCEF) 3 g in dextrose 5 % 50 mL IVPB     3 g 130 mL/hr over 30 Minutes Intravenous Every 6 hours 10/06/16 1833 10/07/16 0121   10/06/16 0600  ceFAZolin (ANCEF) 3 g in dextrose 5 % 50 mL IVPB     3 g 130 mL/hr over 30 Minutes Intravenous On call to O.R. 10/05/16 1356 10/06/16 1100       Patient was given sequential compression devices, early ambulation, and chemoprophylaxis to prevent DVT.  Patient benefited maximally from hospital stay and there were no complications.    Recent vital signs: Patient Vitals for the past 24 hrs:  BP Temp Temp src Pulse Resp SpO2  10/08/16 0625 101/60 98.9 F (37.2 C) Oral 95 16 99 %  10/07/16 2200 136/65 98.3 F (36.8 C) Oral 71 - 98 %  10/07/16 1300 122/71 98.1 F (36.7 C) Oral 88 18 97 %     Recent laboratory studies: No  results for input(s): WBC, HGB, HCT, PLT, NA, K, CL, CO2, BUN, CREATININE, GLUCOSE, INR, CALCIUM in the last 72 hours.  Invalid input(s): PT, 2   Discharge Medications:   Allergies as of 10/08/2016      Reactions   No Known Allergies       Medication List    STOP taking these medications   naproxen 500 MG tablet Commonly known as:  NAPROSYN     TAKE these medications   acetaminophen 325 MG tablet Commonly known as:  TYLENOL Take 650 mg by mouth every 6 (six) hours as needed for mild pain.   aspirin 325 MG EC tablet Take 1 tablet (325 mg total) by mouth 2 (two) times daily after a meal.   bisacodyl 5 MG EC tablet Commonly known as:  DULCOLAX Take 1 tablet (5 mg total) by mouth daily as needed for moderate constipation.   docusate sodium 100 MG capsule Commonly known as:  COLACE Take 1 capsule (100 mg total) by mouth 2 (two) times daily.   lisinopril-hydrochlorothiazide 20-12.5 MG tablet Commonly known as:  PRINZIDE,ZESTORETIC Take 1 tablet by mouth daily.   MAGNESIUM OXIDE PO Take 1 tablet by mouth at bedtime as needed (leg cramps).   Melatonin 5 MG Caps Take 5 mg by mouth at bedtime as needed (sleep).   methocarbamol 750 MG tablet Commonly known as:  ROBAXIN Take 1 tablet (750  mg total) by mouth every 6 (six) hours as needed for muscle spasms. What changed:  medication strength  how much to take   oxyCODONE-acetaminophen 5-325 MG tablet Commonly known as:  ROXICET Take 1-2 tablets by mouth every 4 (four) hours as needed for severe pain.   oxymetazoline 0.05 % nasal spray Commonly known as:  AFRIN Place 1 spray into both nostrils at bedtime as needed for congestion.   PARoxetine 20 MG tablet Commonly known as:  PAXIL TAKE 1 TABLET BY MOUTH EVERY MORNING   triamcinolone cream 0.1 % Commonly known as:  KENALOG Apply 1 application topically 2 (two) times daily as needed (rash).            Durable Medical Equipment        Start     Ordered    10/06/16 1834  DME Walker rolling  Once    Question:  Patient needs a walker to treat with the following condition  Answer:  Primary osteoarthritis of left knee   10/06/16 1833   10/06/16 1834  DME 3 n 1  Once     10/06/16 1833   10/06/16 1834  DME Bedside commode  Once    Question:  Patient needs a bedside commode to treat with the following condition  Answer:  Primary osteoarthritis of left knee   10/06/16 1833       Discharge Care Instructions        Start     Ordered   10/08/16 0000  aspirin 325 MG EC tablet  2 times daily after meals    Question:  Supervising Provider  Answer:  Melrose Nakayama   10/08/16 0651   10/08/16 0000  bisacodyl (DULCOLAX) 5 MG EC tablet  Daily PRN    Question:  Supervising Provider  Answer:  Melrose Nakayama   10/08/16 0651   10/08/16 0000  docusate sodium (COLACE) 100 MG capsule  2 times daily    Question:  Supervising Provider  Answer:  Melrose Nakayama   10/08/16 0651   10/08/16 0000  methocarbamol (ROBAXIN) 750 MG tablet  Every 6 hours PRN    Question:  Supervising Provider  Answer:  Melrose Nakayama   10/08/16 0651   10/08/16 0000  oxyCODONE-acetaminophen (ROXICET) 5-325 MG tablet  Every 4 hours PRN    Question:  Supervising Provider  Answer:  Melrose Nakayama   10/08/16 0651   10/08/16 0000  Call MD / Call 911    Comments:  If you experience chest pain or shortness of breath, CALL 911 and be transported to the hospital emergency room.  If you develope a fever above 101 F, pus (white drainage) or increased drainage or redness at the wound, or calf pain, call your surgeon's office.   10/08/16 0651   10/08/16 0000  Diet - low sodium heart healthy     10/08/16 0651   10/08/16 0000  Constipation Prevention    Comments:  Drink plenty of fluids.  Prune juice may be helpful.  You may use a stool softener, such as Colace (over the counter) 100 mg twice a day.  Use MiraLax (over the counter) for constipation as needed.   10/08/16 0651   10/08/16 0000   Increase activity slowly as tolerated     10/08/16 0651      Diagnostic Studies: No results found.  Disposition: 06-Home-Health Care Svc  Discharge Instructions    Call MD / Call 911    Complete by:  As directed    If  you experience chest pain or shortness of breath, CALL 911 and be transported to the hospital emergency room.  If you develope a fever above 101 F, pus (white drainage) or increased drainage or redness at the wound, or calf pain, call your surgeon's office.   Constipation Prevention    Complete by:  As directed    Drink plenty of fluids.  Prune juice may be helpful.  You may use a stool softener, such as Colace (over the counter) 100 mg twice a day.  Use MiraLax (over the counter) for constipation as needed.   Diet - low sodium heart healthy    Complete by:  As directed    Increase activity slowly as tolerated    Complete by:  As directed       Follow-up Information    Melrose Nakayama, MD. Schedule an appointment as soon as possible for a visit in 2 week(s).   Specialty:  Orthopedic Surgery Contact information: [place] [address] [address], [facility] Follow up.   Why:  For home health PT. They will call in 1-2 days to set up your first [facility] visit. Contact information: [address] [place] [address] [phone]            Signed: Rich Fuchs 10/08/2016, 6:52 AM

## 2016-10-08 NOTE — Discharge Instructions (Signed)
Call MD / Call 911    Comments:  If you experience chest pain or shortness of breath, CALL 911 and be transported to the hospital emergency room.  If you develope a fever above 101 F, pus (white drainage) or increased drainage or redness at the wound, or calf pain, call your surgeon's office.  Diet - low sodium heart healthy    Constipation Prevention    Comments:  Drink plenty of fluids.  Prune juice may be helpful.  You may use a stool softener, such as Colace (over the counter) 100 mg twice a day.  Use MiraLax (over the counter) for constipation as needed.    Increase activity slowly as tolerated       Incision Care, Adult An incision is a surgical cut that is made through your skin. Most incisions are closed after surgery. Your incision may be closed with stitches (sutures), staples, skin glue, or adhesive strips. You may need to return to your health care provider to have sutures or staples removed. This may occur several days to several weeks after your surgery. The incision needs to be cared for properly to prevent infection. How to care for your incision Incision care   Follow instructions from your health care provider about how to take care of your incision. Make sure you: ? Wash your hands with soap and water before you change the bandage (dressing). If soap and water are not available, use hand sanitizer. ? Change your dressing as told by your health care provider. ? Leave sutures, skin glue, or adhesive strips in place. These skin closures may need to stay in place for 2 weeks or longer. If adhesive strip edges start to loosen and curl up, you may trim the loose edges. Do not remove adhesive strips completely unless your health care provider tells you to do that.  Check your incision area every day for signs of infection. Check for: ? More redness, swelling, or pain. ? More fluid or blood. ? Warmth. ? Pus or a bad smell.  Ask your health care provider how to clean the  incision. This may include: ? Using mild soap and water. ? Using a clean towel to pat the incision dry after cleaning it. ? Applying a cream or ointment. Do this only as told by your health care provider. ? Covering the incision with a clean dressing.  Ask your health care provider when you can leave the incision uncovered.  Do not take baths, swim, or use a hot tub until your health care provider approves. Ask your health care provider if you can take showers. You may only be allowed to take sponge baths for bathing. Medicines  If you were prescribed an antibiotic medicine, cream, or ointment, take or apply the antibiotic as told by your health care provider. Do not stop taking or applying the antibiotic even if your condition improves.  Take over-the-counter and prescription medicines only as told by your health care provider. General instructions  Limit movement around your incision to improve healing. ? Avoid straining, lifting, or exercise for the first month, or for as long as told by your health care provider. ? Follow instructions from your health care provider about returning to your normal activities. ? Ask your health care provider what activities are safe.  Protect your incision from the sun when you are outside for the first 6 months, or for as long as told by your health care provider. Apply sunscreen around the scar or cover it  up.  Keep all follow-up visits as told by your health care provider. This is important. Contact a health care provider if:  Your have more redness, swelling, or pain around the incision.  You have more fluid or blood coming from the incision.  Your incision feels warm to the touch.  You have pus or a bad smell coming from the incision.  You have a fever or shaking chills.  You are nauseous or you vomit.  You are dizzy.  Your sutures or staples come undone. Get help right away if:  You have a red streak coming from your  incision.  Your incision bleeds through the dressing and the bleeding does not stop with gentle pressure.  The edges of your incision open up and separate.  You have severe pain.  You have a rash.  You are confused.  You faint.  You have trouble breathing and a fast heartbeat. This information is not intended to replace advice given to you by your health care provider. Make sure you discuss any questions you have with your health care provider. Document Released: 08/01/2004 Document Revised: 09/20/2015 Document Reviewed: 07/31/2015 Elsevier Interactive Patient Education  [employer].

## 2016-10-08 NOTE — Progress Notes (Signed)
Physical Therapy Treatment Patient Details Name: Jeremy Bennett MRN: [id number] DOB: [birth year] Today's Date: 10/08/2016    History of Present Illness Pt is a 61 y.o. male now s/p elective L TKA. 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     PT Comments    Pt performed gait and stair training in prep for d/c home.  Reviewed and educated on technique for Home HEP.  Pt resting in supported extension with ice pack applied.  Informed RN that patient is ready for d/c home.      Follow Up Recommendations  DC plan and follow up therapy as arranged by surgeon;Home health PT     Equipment Recommendations  None recommended by PT (owns all DME from previous surgery.  )    Recommendations for Other Services       Precautions / Restrictions Precautions Precautions: Knee Precaution Booklet Issued: No Precaution Comments: Verbally reviewed precautions Restrictions Weight Bearing Restrictions: Yes LLE Weight Bearing: Weight bearing as tolerated    Mobility  Bed Mobility Overal bed mobility: Needs Assistance Bed Mobility: Supine to Sit;Sit to Supine     Supine to sit: Modified independent (Device/Increase time);HOB elevated Sit to supine: Modified independent (Device/Increase time)   General bed mobility comments: increased time; use of handrail   Transfers Overall transfer level: Modified independent Equipment used: Rolling walker (2 wheeled) Transfers: Sit to/from Stand Sit to Stand: Modified independent (Device/Increase time)         General transfer comment: Good technique, increased time due to pain.    Ambulation/Gait Ambulation/Gait assistance: Supervision Ambulation Distance (Feet): 200 Feet Assistive device: Rolling walker (2 wheeled) Gait Pattern/deviations: Step-through pattern;Decreased stride length;Decreased stance time - left;Decreased step length - right;Trunk flexed Gait velocity: Decreased Gait velocity interpretation:  Below normal speed for age/gender General Gait Details: Cues for upper trunk control, weight shifting L and increasing step length on the R.    Stairs Stairs: Yes   Stair Management: No rails;Forwards;With walker Number of Stairs: 2 General stair comments: curb training x2 trials.  Pt required cues for RW placement and cues for sequencing.  Pt tolerated well and able to teach back method on second attempt.    Wheelchair Mobility    Modified Rankin (Stroke Patients Only)       Balance Overall balance assessment: Needs assistance Sitting-balance support: No upper extremity supported;Feet supported;Feet unsupported Sitting balance-Leahy Scale: Good     Standing balance support: No upper extremity supported;Bilateral upper extremity supported;During functional activity Standing balance-Leahy Scale: Fair Standing balance comment: static standing without UE support with MinGuard                             Cognition Arousal/Alertness: Awake/alert Behavior During Therapy: WFL for tasks assessed/performed Overall Cognitive Status: Within Functional Limits for tasks assessed                                        Exercises Total Joint Exercises Ankle Circles/Pumps: AROM;Both;10 reps;Supine Quad Sets: AROM;10 reps;Supine;Left Towel Squeeze: AROM;Both;10 reps;Supine Short Arc Quad: AROM;Left;10 reps;Supine Heel Slides: AROM;Left;10 reps;Supine Hip ABduction/ADduction: AROM;Left;10 reps;Supine Straight Leg Raises: AROM;Left;10 reps;Supine Goniometric ROM: 69 degrees flexion in L knee.      General Comments        Pertinent Vitals/Pain Pain Assessment: 0-10 Pain Score: 5  Faces Pain Scale: Hurts  little more Pain Location: L knee Pain Descriptors / Indicators: Aching;Discomfort;Sore Pain Intervention(s): Monitored during session;Repositioned;Ice applied    Home Living                      Prior Function            PT Goals  (current goals can now be found in the care plan section) Acute Rehab PT Goals Patient Stated Goal: Return home Potential to Achieve Goals: Good Progress towards PT goals: Progressing toward goals    Frequency    7X/week      PT Plan Current plan remains appropriate    Co-evaluation              AM-PAC PT "6 Clicks" Daily Activity  Outcome Measure  Difficulty turning over in bed (including adjusting bedclothes, sheets and blankets)?: None Difficulty moving from lying on back to sitting on the side of the bed? : None Difficulty sitting down on and standing up from a chair with arms (e.g., wheelchair, bedside commode, etc,.)?: A Little Help needed moving to and from a bed to chair (including a wheelchair)?: A Little Help needed walking in hospital room?: A Little Help needed climbing 3-5 steps with a railing? : A Little 6 Click Score: 20    End of Session Equipment Utilized During Treatment: Gait belt Activity Tolerance: Patient tolerated treatment well Patient left: in bed;with call bell/phone within reach;with family/visitor present Nurse Communication: Mobility status PT Visit Diagnosis: Other abnormalities of gait and mobility (R26.89);Pain Pain - Right/Left: Left Pain - part of body: Knee     Time: 1040-1103 PT Time Calculation (min) (ACUTE ONLY): 23 min  Charges:  $Gait Training: 8-22 mins $Therapeutic Exercise: 8-22 mins                    G Codes:       Governor Rooks, PTA pager [phone]    Cristela Blue 10/08/2016, 11:09 AM

## 2016-10-08 NOTE — Progress Notes (Signed)
Occupational Therapy Treatment Patient Details Name: Jeremy Bennett MRN: [id number] DOB: 03-19-1955 Today's Date: 10/08/2016    History of present illness Pt is a 61 y.o. male now s/p elective L TKA. 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    OT comments  Pt progressing towards goals. Completed tub transfer to tub bench and functional mobility at RW level with overall MinGuard assist this session. Pt requires increased seated rest breaks throughout due to increased fatigue/nausea after transfer completion. Will continue to follow acutely to progress Pt's safety and independence with ADLs and functional mobility prior to return home.    Follow Up Recommendations  DC plan and follow up therapy as arranged by surgeon;Supervision/Assistance - 24 hour    Equipment Recommendations  None recommended by OT;Other (comment) (DME needs are met )          Precautions / Restrictions Precautions Precautions: Knee Precaution Comments: Verbally reviewed precautions Restrictions Weight Bearing Restrictions: Yes LLE Weight Bearing: Weight bearing as tolerated       Mobility Bed Mobility Overal bed mobility: Needs Assistance Bed Mobility: Supine to Sit;Sit to Supine     Supine to sit: Modified independent (Device/Increase time);HOB elevated Sit to supine: Modified independent (Device/Increase time)   General bed mobility comments: increased time; use of handrail   Transfers Overall transfer level: Needs assistance Equipment used: Rolling walker (2 wheeled) Transfers: Sit to/from Stand Sit to Stand: Min guard         General transfer comment: close guard for safety; Pt demonstrates good hand/LE placement     Balance Overall balance assessment: Needs assistance Sitting-balance support: No upper extremity supported;Feet supported;Feet unsupported Sitting balance-Leahy Scale: Good     Standing balance support: No upper extremity  supported;Bilateral upper extremity supported;During functional activity Standing balance-Leahy Scale: Fair Standing balance comment: static standing without UE support with MinGuard                            ADL either performed or assessed with clinical judgement   ADL Overall ADL's : Needs assistance/impaired Eating/Feeding: Independent;Sitting                     Lower Body Dressing Details (indicate cue type and reason): total assist to don socks this session at bed level          Tub/ Shower Transfer: Tub transfer;Min guard;Tub bench;Rolling walker Tub/Shower Transfer Details (indicate cue type and reason): Pt reports he has wide drop arm 3:1 at home for tub transfers; utilized tub bench to simulate transfer with Pt return demonstrating  Functional mobility during ADLs: Min guard;Rolling walker General ADL Comments: Pt requires seated rest breaks throughout session due to fatigue/increased nausea after tub transfer completion                        Cognition Arousal/Alertness: Awake/alert Behavior During Therapy: WFL for tasks assessed/performed Overall Cognitive Status: Within Functional Limits for tasks assessed                                                            Pertinent Vitals/ Pain       Pain Assessment: Faces Faces Pain Scale: Hurts little more  Pain Location: L knee Pain Descriptors / Indicators: Aching;Discomfort;Sore Pain Intervention(s): Limited activity within patient's tolerance;Monitored during session;Ice applied;Premedicated before session                                                          Frequency  Min 2X/week        Progress Toward Goals  OT Goals(current goals can now be found in the care plan section)  Progress towards OT goals: Progressing toward goals  Acute Rehab OT Goals Patient Stated Goal: Return home OT Goal Formulation: With patient Time For  Goal Achievement: 10/21/16 Potential to Achieve Goals: Good  Plan Discharge plan remains appropriate                     AM-PAC PT "6 Clicks" Daily Activity     Outcome Measure   Help from another person eating meals?: None Help from another person taking care of personal grooming?: A Little Help from another person toileting, which includes using toliet, bedpan, or urinal?: A Little Help from another person bathing (including washing, rinsing, drying)?: A Little Help from another person to put on and taking off regular upper body clothing?: None Help from another person to put on and taking off regular lower body clothing?: A Lot 6 Click Score: 19    End of Session Equipment Utilized During Treatment: Gait belt;Rolling walker CPM Left Knee CPM Left Knee: Off  OT Visit Diagnosis: Other abnormalities of gait and mobility (R26.89)   Activity Tolerance Patient tolerated treatment well   Patient Left in bed;with call bell/phone within reach;with family/visitor present   Nurse Communication Mobility status        Time: 4982-6415 OT Time Calculation (min): 28 min  Charges: OT General Charges $OT Visit: 1 Visit OT Treatments $Self Care/Home Management : 23-37 mins  Lou Cal, OT Pager [phone] 10/08/2016    Raymondo Band 10/08/2016, 10:10 AM

## 2017-10-31 IMAGING — CR DG CHEST 2V
2 series · 2 of 2 positions shown · non-contrast
Comparison: 07/14/2005

CLINICAL DATA: Preop knee replacement

EXAM:
CHEST  2 VIEW

[w chest pa]
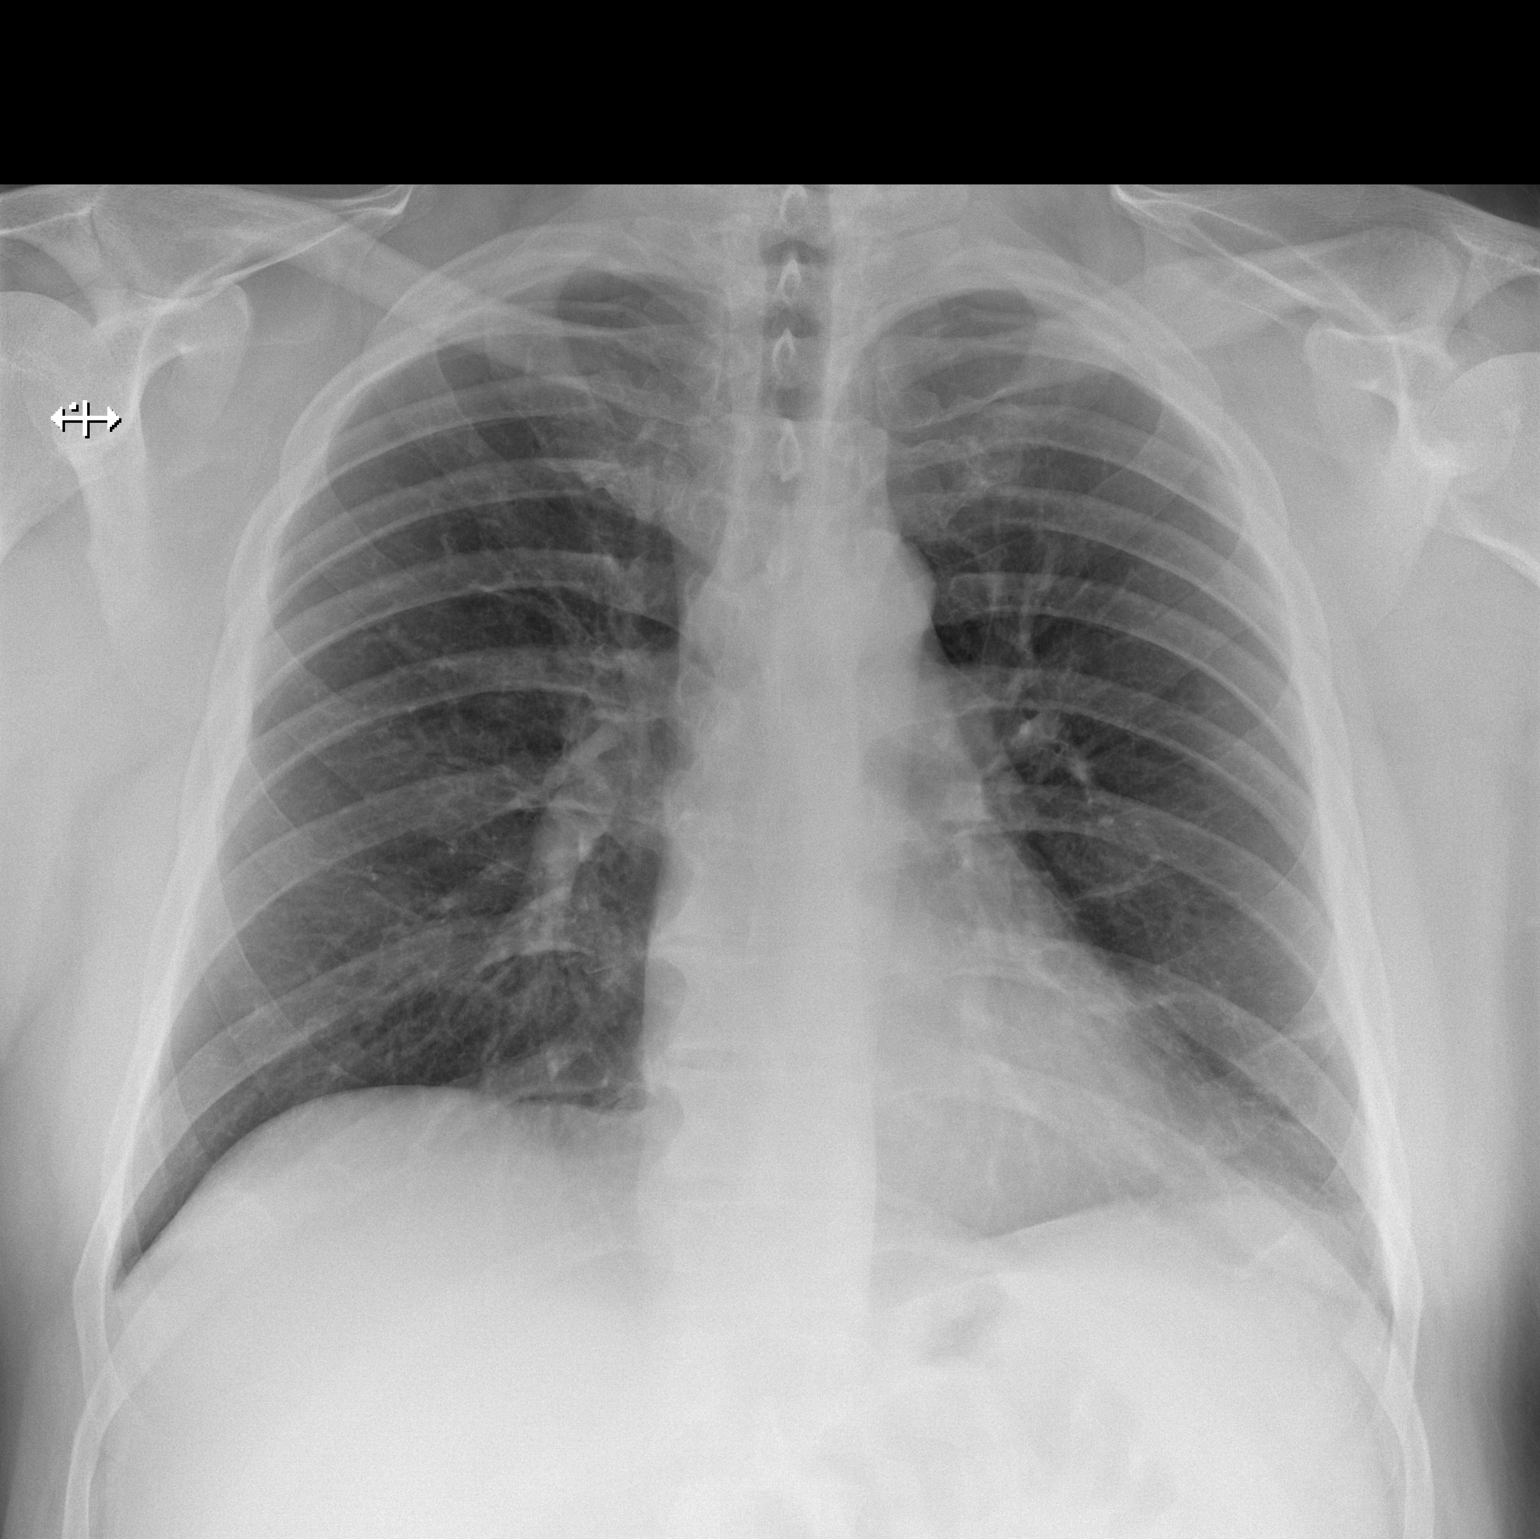

[w chest lat]
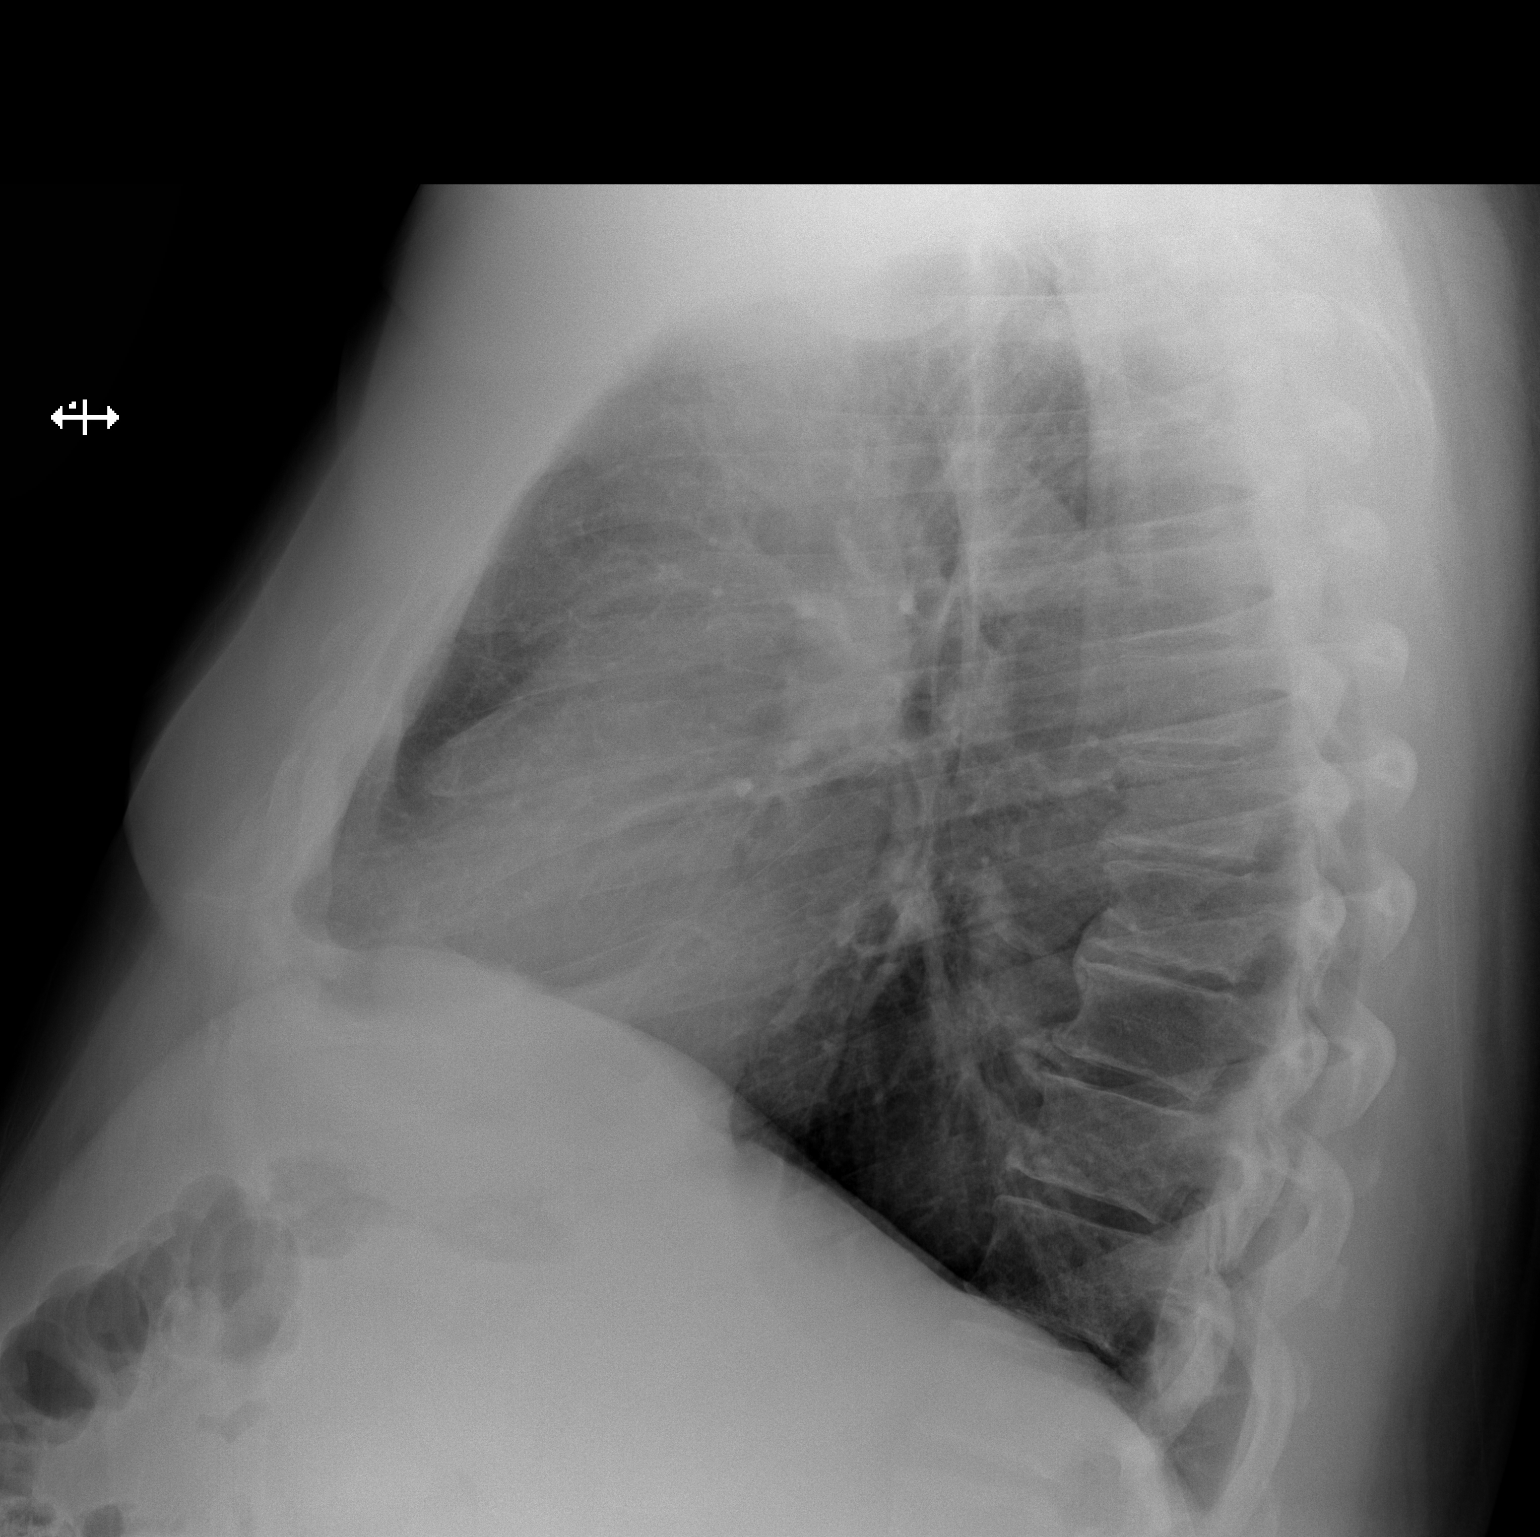

[2 of 2 positions shown; findings below may reference images not displayed]

FINDINGS: The heart size and mediastinal contours are within normal limits.
Both lungs are clear. The visualized skeletal structures are
unremarkable.
IMPRESSION: No active cardiopulmonary disease.

## 2018-02-23 ENCOUNTER — Ambulatory Visit (INDEPENDENT_AMBULATORY_CARE_PROVIDER_SITE_OTHER): Payer: 59 | Admitting: Family Medicine

## 2018-02-23 ENCOUNTER — Encounter (INDEPENDENT_AMBULATORY_CARE_PROVIDER_SITE_OTHER): Payer: Self-pay | Admitting: *Deleted

## 2018-02-23 ENCOUNTER — Encounter: Payer: Self-pay | Admitting: Family Medicine

## 2018-02-23 VITALS — BP 129/86 | HR 68 | Temp 96.8°F | Ht 74.0 in | Wt 303.6 lb

## 2018-02-23 DIAGNOSIS — I10 Essential (primary) hypertension: Secondary | ICD-10-CM

## 2018-02-23 DIAGNOSIS — Z23 Encounter for immunization: Secondary | ICD-10-CM | POA: Diagnosis not present

## 2018-02-23 DIAGNOSIS — F419 Anxiety disorder, unspecified: Secondary | ICD-10-CM | POA: Diagnosis not present

## 2018-02-23 DIAGNOSIS — Z1159 Encounter for screening for other viral diseases: Secondary | ICD-10-CM

## 2018-02-23 DIAGNOSIS — Z1211 Encounter for screening for malignant neoplasm of colon: Secondary | ICD-10-CM

## 2018-02-23 MED ORDER — TRIAMCINOLONE ACETONIDE 0.1 % EX CREA: 1.0000 "application " | TOPICAL_CREAM | Freq: Two times a day (BID) | CUTANEOUS | 2 refills | Status: DC | PRN

## 2018-02-23 MED ORDER — PAROXETINE HCL 20 MG PO TABS: 20.0000 mg | ORAL_TABLET | Freq: Every morning | ORAL | 1 refills | Status: DC

## 2018-02-23 MED ORDER — LISINOPRIL-HYDROCHLOROTHIAZIDE 20-12.5 MG PO TABS: 1.0000 | ORAL_TABLET | Freq: Every day | ORAL | 1 refills | Status: DC

## 2018-02-23 NOTE — Progress Notes (Signed)
BP 129/86   Pulse 68   Temp (!) 96.8 F (36 C) (Oral)   Ht '6\' 2"'$  (1.88 m)   Wt (!) 303 lb 9.6 oz (137.7 kg)   BMI 38.98 kg/m    Subjective:    Patient ID: Jeremy Bennett, male    DOB: Mar 04, 1955, 63 y.o.   MRN: [id number]  HPI: Jeremy Bennett is a 63 y.o. male presenting on 02/23/2018 for New Patient (Initial Visit) (Dr. Scotty Court ); Establish Care; and med refills   HPI Hypertension Patient is currently on hydrochlorothiazide and is a new patient to her office, and their blood pressure today is 129/86. Patient denies any lightheadedness or dizziness. Patient denies headaches, blurred vision, chest pains, shortness of breath, or weakness. Denies any side effects from medication and is content with current medication.   Anxiety Patient is coming in for recheck of anxiety and to establish as a new care patient to our office.  He has been on paroxetine 20 mg for quite a few years and says that it is working for him.  He is coming to our office from Dr. Rayna Sexton office.  He denies any suicidal ideations or thoughts of hurting himself.  He says his anxiety has been controlled on the medication. Depression screen PHQ 2/9 02/23/2018  Decreased Interest 0  Down, Depressed, Hopeless 0  PHQ - 2 Score 0     Relevant past medical, surgical, family and social history reviewed and updated as indicated. Interim medical history since our last visit reviewed. Allergies and medications reviewed and updated.  Review of Systems  Constitutional: Negative for chills and fever.  Eyes: Negative for discharge.  Respiratory: Negative for shortness of breath and wheezing.   Cardiovascular: Negative for chest pain and leg swelling.  Musculoskeletal: Negative for back pain and gait problem.  Skin: Negative for rash.  Neurological: Negative for dizziness, weakness, light-headedness and numbness.  Psychiatric/Behavioral: Negative for decreased concentration, dysphoric mood, self-injury, sleep disturbance and  suicidal ideas. The patient is nervous/anxious.   All other systems reviewed and are negative.   Per HPI unless specifically indicated above  Social History   Socioeconomic History  . Marital status: Married    Spouse name: Not on file  . Number of children: Not on file  . Years of education: Not on file  . Highest education level: Not on file  Occupational History  . Not on file  Social Needs  . Financial resource strain: Not on file  . Food insecurity:    Worry: Not on file    Inability: Not on file  . Transportation needs:    Medical: Not on file    Non-medical: Not on file  Tobacco Use  . Smoking status: Never Smoker  . Smokeless tobacco: Never Used  Substance and Sexual Activity  . Alcohol use: Yes    Alcohol/week: 1.0 standard drinks    Types: 1 Cans of beer per week    Comment: in a week's time  . Drug use: No  . Sexual activity: Not on file  Lifestyle  . Physical activity:    Days per week: Not on file    Minutes per session: Not on file  . Stress: Not on file  Relationships  . Social connections:    Talks on phone: Not on file    Gets together: Not on file    Attends religious service: Not on file    Active member of club or organization: Not on file  Attends meetings of clubs or organizations: Not on file    Relationship status: Not on file  . Intimate partner violence:    Fear of current or ex partner: Not on file    Emotionally abused: Not on file    Physically abused: Not on file    Forced sexual activity: Not on file  Other Topics Concern  . Not on file  Social History Narrative  . Not on file    Past Surgical History:  Procedure Laterality Date  . COLONOSCOPY    . FRACTURE SURGERY     left clavicle   1972  . KNEE ARTHROSCOPY     right  . 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   Procedure: TOTAL KNEE ARTHROPLASTY;  Surgeon: Melrose Nakayama, MD;  Location: [facility];  Service: Orthopedics;   Laterality: Right;  . TOTAL KNEE ARTHROPLASTY Left 10/06/2016   Procedure: TOTAL KNEE ARTHROPLASTY;  Surgeon: Melrose Nakayama, MD;  Location: [facility];  Service: Orthopedics;  Laterality: Left;    Family History  Problem Relation Age of Onset  . HIV/AIDS Father     Allergies as of 02/23/2018      Reactions   No Known Allergies       Medication List       Accurate as of February 23, 2018 11:59 PM. Always use your most recent med list.        docusate sodium 100 MG capsule Commonly known as:  COLACE Take 1 capsule (100 mg total) by mouth 2 (two) times daily.   lisinopril-hydrochlorothiazide 20-12.5 MG tablet Commonly known as:  PRINZIDE,ZESTORETIC Take 1 tablet by mouth daily.   PARoxetine 20 MG tablet Commonly known as:  PAXIL Take 1 tablet (20 mg total) by mouth every morning.   triamcinolone cream 0.1 % Commonly known as:  KENALOG Apply 1 application topically 2 (two) times daily as needed (rash).          Objective:    BP 129/86   Pulse 68   Temp (!) 96.8 F (36 C) (Oral)   Ht '6\' 2"'$  (1.88 m)   Wt (!) 303 lb 9.6 oz (137.7 kg)   BMI 38.98 kg/m   Wt Readings from Last 3 Encounters:  02/23/18 (!) 303 lb 9.6 oz (137.7 kg)  10/06/16 295 lb (133.8 kg)  09/29/16 297 lb 8 oz (134.9 kg)    Physical Exam Vitals signs and nursing note reviewed.  Constitutional:      General: He is not in acute distress.    Appearance: He is well-developed. He is not diaphoretic.  Eyes:     General: No scleral icterus.    Conjunctiva/sclera: Conjunctivae normal.  Neck:     Musculoskeletal: Neck supple.     Thyroid: No thyromegaly.  Cardiovascular:     Rate and Rhythm: Normal rate and regular rhythm.     Heart sounds: Normal heart sounds. No murmur.  Pulmonary:     Effort: Pulmonary effort is normal. No respiratory distress.     Breath sounds: Normal breath sounds. No wheezing.  Musculoskeletal: Normal range of motion.  Lymphadenopathy:     Cervical: No cervical adenopathy.    Skin:    General: Skin is warm and dry.     Findings: No rash.  Neurological:     Mental Status: He is alert and oriented to person, place, and time.     Coordination: Coordination normal.  Psychiatric:        Behavior:  Behavior normal.         Assessment & Plan:   Problem List Items Addressed This Visit      Cardiovascular and Mediastinum   Hypertension - Primary   Relevant Medications   lisinopril-hydrochlorothiazide (PRINZIDE,ZESTORETIC) 20-12.5 MG tablet   Other Relevant Orders   CMP14+EGFR (Completed)   Lipid panel (Completed)     Other   Anxiety   Relevant Medications   PARoxetine (PAXIL) 20 MG tablet   Other Relevant Orders   CBC with Differential/Platelet (Completed)    Other Visit Diagnoses    Need for immunization against influenza       Relevant Orders   Flu Vaccine QUAD 36+ mos IM (Completed)   Colon cancer screening       Relevant Orders   Ambulatory referral to Gastroenterology   Need for hepatitis C screening test       Relevant Orders   Hepatitis C antibody (Completed)       Follow up plan: Return in about 6 months (around 08/24/2018), or if symptoms worsen or fail to improve, for Recheck anxiety and hypertension.  Caryl Pina, MD [facility] 03/02/2018, 9:00 PM

## 2018-02-24 LAB — LIPID PANEL
Chol/HDL Ratio: 4.7 ratio (ref 0.0–5.0)
Cholesterol, Total: 199 mg/dL (ref 100–199)
HDL: 42 mg/dL (ref >39)
LDL Calculated: 117 mg/dL — ABNORMAL HIGH (ref 0–99)
Triglycerides: 199 mg/dL — ABNORMAL HIGH (ref 0–149)
VLDL CHOLESTEROL CAL: 40 mg/dL (ref 5–40)

## 2018-02-24 LAB — CBC WITH DIFFERENTIAL/PLATELET
Basophils Absolute: 0 10*3/uL (ref 0.0–0.2)
Basos: 1 %
EOS (ABSOLUTE): 0.3 10*3/uL (ref 0.0–0.4)
Eos: 5 %
Hematocrit: 45.9 % (ref 37.5–51.0)
Hemoglobin: 16 g/dL (ref 13.0–17.7)
IMMATURE GRANULOCYTES: 0 %
Immature Grans (Abs): 0 10*3/uL (ref 0.0–0.1)
Lymphocytes Absolute: 1.6 10*3/uL (ref 0.7–3.1)
Lymphs: 31 %
MCH: 31.5 pg (ref 26.6–33.0)
MCHC: 34.9 g/dL (ref 31.5–35.7)
MCV: 90 fL (ref 79–97)
MONOCYTES: 6 %
Monocytes Absolute: 0.3 10*3/uL (ref 0.1–0.9)
NEUTROS PCT: 57 %
Neutrophils Absolute: 3 10*3/uL (ref 1.4–7.0)
Platelets: 172 10*3/uL (ref 150–450)
RBC: 5.08 x10E6/uL (ref 4.14–5.80)
RDW: 12.8 % (ref 11.6–15.4)
WBC: 5.2 10*3/uL (ref 3.4–10.8)

## 2018-02-24 LAB — CMP14+EGFR
ALT: 42 IU/L (ref 0–44)
AST: 34 IU/L (ref 0–40)
Albumin/Globulin Ratio: 1.9 (ref 1.2–2.2)
Albumin: 4.6 g/dL (ref 3.8–4.8)
Alkaline Phosphatase: 61 IU/L (ref 39–117)
BUN/Creatinine Ratio: 12 (ref 10–24)
BUN: 11 mg/dL (ref 8–27)
Bilirubin Total: 1.3 mg/dL — ABNORMAL HIGH (ref 0.0–1.2)
CO2: 21 mmol/L (ref 20–29)
CREATININE: 0.89 mg/dL (ref 0.76–1.27)
Calcium: 9.6 mg/dL (ref 8.6–10.2)
Chloride: 101 mmol/L (ref 96–106)
GFR calc Af Amer: 107 mL/min/{1.73_m2} (ref >59)
GFR calc non Af Amer: 92 mL/min/{1.73_m2} (ref >59)
Globulin, Total: 2.4 g/dL (ref 1.5–4.5)
Glucose: 106 mg/dL — ABNORMAL HIGH (ref 65–99)
Potassium: 3.9 mmol/L (ref 3.5–5.2)
Sodium: 141 mmol/L (ref 134–144)
Total Protein: 7 g/dL (ref 6.0–8.5)

## 2018-02-24 LAB — HEPATITIS C ANTIBODY: Hep C Virus Ab: <0.1 s/co ratio (ref 0.0–0.9)

## 2018-02-25 ENCOUNTER — Other Ambulatory Visit: Payer: Self-pay | Admitting: *Deleted

## 2018-04-26 ENCOUNTER — Encounter: Payer: Self-pay | Admitting: Family Medicine

## 2018-04-26 ENCOUNTER — Ambulatory Visit (INDEPENDENT_AMBULATORY_CARE_PROVIDER_SITE_OTHER): Payer: 59 | Admitting: Family Medicine

## 2018-04-26 ENCOUNTER — Other Ambulatory Visit: Payer: Self-pay

## 2018-04-26 DIAGNOSIS — K529 Noninfective gastroenteritis and colitis, unspecified: Secondary | ICD-10-CM

## 2018-04-26 NOTE — Progress Notes (Signed)
Virtual Visit via telephone Note  I connected with Jeremy Bennett on 04/26/18 at 0905 by telephone and verified that I am speaking with the correct person using two identifiers. Jeremy Bennett is currently located at home and wife is currently with them during visit. The provider, Monia Pouch, FNP is located in their office at time of visit.  I discussed the limitations, risks, security and privacy concerns of performing an evaluation and management service by telephone and the availability of in person appointments. I also discussed with the patient that there may be a patient responsible charge related to this service. The patient expressed understanding and agreed to proceed.  Subjective:  Patient ID: Jeremy Bennett, male    DOB: 18-Nov-1955, 63 y.o.   MRN: [id number]  Chief Complaint:  Diarrhea   HPI: Jeremy Bennett is a 63 y.o. male presenting on 04/26/2018 for Diarrhea   Pt reports he developed myalgias, low grade fever, and diarrhea on Sunday. States he is feeling much better today but is concerned about COVID-19. Pt states his children and grandchildren recently had n/v/d. States they have since recovered. He states he has not had vomiting. States he last had diarrhea at 9 pm yesterday. States he has had nausea. States his temp was 99 this morning. He denies cough, sore throat, shortness of breath, headaches, or chest pain. No recent travel or known sick exposures. States the only place he has been in the last 14 days was [employer] 10 days ago. States he was only there for 5 minutes. He states he is staying hydrated. No other associated signs or symptoms.    Relevant past medical, surgical, family, and social history reviewed and updated as indicated.  Allergies and medications reviewed and updated.   Past Medical History:  Diagnosis Date  . ADD (attention deficit disorder)   . Arthritis   . Gout   . History of asbestosis   . Hypertension   . Sleep apnea    tested more than 5  yrs ago    Past Surgical History:  Procedure Laterality Date  . COLONOSCOPY    . FRACTURE SURGERY     left clavicle   1972  . KNEE ARTHROSCOPY     right  . 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   Procedure: TOTAL KNEE ARTHROPLASTY;  Surgeon: Melrose Nakayama, MD;  Location: [facility];  Service: Orthopedics;  Laterality: Right;  . TOTAL KNEE ARTHROPLASTY Left 10/06/2016   Procedure: TOTAL KNEE ARTHROPLASTY;  Surgeon: Melrose Nakayama, MD;  Location: [facility];  Service: Orthopedics;  Laterality: Left;    Social History   Socioeconomic History  . Marital status: Married    Spouse name: Not on file  . Number of children: Not on file  . Years of education: Not on file  . Highest education level: Not on file  Occupational History  . Not on file  Social Needs  . Financial resource strain: Not on file  . Food insecurity:    Worry: Not on file    Inability: Not on file  . Transportation needs:    Medical: Not on file    Non-medical: Not on file  Tobacco Use  . Smoking status: Never Smoker  . Smokeless tobacco: Never Used  Substance and Sexual Activity  . Alcohol use: Yes    Alcohol/week: 1.0 standard drinks    Types: 1 Cans of beer per week    Comment: in a week's time  .  Drug use: No  . Sexual activity: Not on file  Lifestyle  . Physical activity:    Days per week: Not on file    Minutes per session: Not on file  . Stress: Not on file  Relationships  . Social connections:    Talks on phone: Not on file    Gets together: Not on file    Attends religious service: Not on file    Active member of club or organization: Not on file    Attends meetings of clubs or organizations: Not on file    Relationship status: Not on file  . Intimate partner violence:    Fear of current or ex partner: Not on file    Emotionally abused: Not on file    Physically abused: Not on file    Forced sexual activity: Not on file  Other Topics Concern  .  Not on file  Social History Narrative  . Not on file    Outpatient Encounter Medications as of 04/26/2018  Medication Sig  . docusate sodium (COLACE) 100 MG capsule Take 1 capsule (100 mg total) by mouth 2 (two) times daily.  Marland Kitchen lisinopril-hydrochlorothiazide (PRINZIDE,ZESTORETIC) 20-12.5 MG tablet Take 1 tablet by mouth daily.  Marland Kitchen PARoxetine (PAXIL) 20 MG tablet Take 1 tablet (20 mg total) by mouth every morning.  . triamcinolone cream (KENALOG) 0.1 % Apply 1 application topically 2 (two) times daily as needed (rash).   No facility-administered encounter medications on file as of 04/26/2018.     Allergies  Allergen Reactions  . No Known Allergies     Review of Systems  Constitutional: Positive for fever. Negative for activity change, appetite change, chills, fatigue and unexpected weight change.  HENT: Negative for congestion, ear pain, postnasal drip, rhinorrhea, sinus pressure, sinus pain, sneezing, sore throat, tinnitus, trouble swallowing and voice change.   Respiratory: Negative for cough, chest tightness, shortness of breath and wheezing.   Cardiovascular: Negative for chest pain and palpitations.  Gastrointestinal: Positive for diarrhea and nausea. Negative for abdominal distention, abdominal pain, anal bleeding, blood in stool, constipation and rectal pain.  Genitourinary: Negative for decreased urine volume and difficulty urinating.  Musculoskeletal: Positive for myalgias.  Skin: Negative for color change and rash.  Neurological: Positive for headaches. Negative for dizziness, tremors, seizures, syncope, facial asymmetry, speech difficulty, weakness, light-headedness and numbness.  Psychiatric/Behavioral: Negative for confusion.  All other systems reviewed and are negative.        Observations/Objective: No VS, this was a telephone or virtual health encounter.  Pt alert and oriented, answers all questions appropriately, and able to speak in full sentences.     Assessment and Plan: Diagnoses and all orders for this visit:  Gastroenteritis Symptomatic care discussed. Report any new or worsening symptoms. Red flags that warrant emergent evaluation discussed.     Follow Up Instructions: Return if symptoms worsen or fail to improve.    I discussed the assessment and treatment plan with the patient. The patient was provided an opportunity to ask questions and all were answered. The patient agreed with the plan and demonstrated an understanding of the instructions.   The patient was advised to call back or seek an in-person evaluation if the symptoms worsen or if the condition fails to improve as anticipated.  The above assessment and management plan was discussed with the patient. The patient verbalized understanding of and has agreed to the management plan. Patient is aware to call the clinic if symptoms persist or worsen. Patient is  aware when to return to the clinic for a follow-up visit. Patient educated on when it is appropriate to go to the emergency department.    I provided 15 minutes of non-face-to-face time during this encounter. The call started at 0905. The call ended at 0920.   Monia Pouch, FNP-C [facility] [address] [place], [place] [address] [phone]

## 2018-08-16 ENCOUNTER — Other Ambulatory Visit: Payer: Self-pay | Admitting: Family Medicine

## 2018-08-16 DIAGNOSIS — F419 Anxiety disorder, unspecified: Secondary | ICD-10-CM

## 2018-08-16 DIAGNOSIS — I10 Essential (primary) hypertension: Secondary | ICD-10-CM

## 2018-09-09 ENCOUNTER — Other Ambulatory Visit: Payer: Self-pay | Admitting: Family Medicine

## 2018-09-09 DIAGNOSIS — F419 Anxiety disorder, unspecified: Secondary | ICD-10-CM

## 2018-09-09 DIAGNOSIS — I10 Essential (primary) hypertension: Secondary | ICD-10-CM

## 2018-09-12 NOTE — Telephone Encounter (Signed)
Dettinger. MTBS 30 days given 08/16/18

## 2018-09-23 ENCOUNTER — Other Ambulatory Visit: Payer: Self-pay | Admitting: Family Medicine

## 2018-09-23 DIAGNOSIS — F419 Anxiety disorder, unspecified: Secondary | ICD-10-CM

## 2018-09-23 NOTE — Telephone Encounter (Signed)
Last seen 01/2018

## 2018-09-26 ENCOUNTER — Other Ambulatory Visit (INDEPENDENT_AMBULATORY_CARE_PROVIDER_SITE_OTHER): Payer: Self-pay | Admitting: *Deleted

## 2018-09-26 DIAGNOSIS — Z1211 Encounter for screening for malignant neoplasm of colon: Secondary | ICD-10-CM

## 2018-09-30 DIAGNOSIS — G4733 Obstructive sleep apnea (adult) (pediatric): Secondary | ICD-10-CM | POA: Diagnosis not present

## 2018-10-07 ENCOUNTER — Other Ambulatory Visit: Payer: Self-pay | Admitting: Family Medicine

## 2018-10-07 ENCOUNTER — Encounter: Payer: Self-pay | Admitting: *Deleted

## 2018-10-07 DIAGNOSIS — I10 Essential (primary) hypertension: Secondary | ICD-10-CM

## 2018-10-07 NOTE — Telephone Encounter (Signed)
Dettinger. NTBS 30 days given 09/12/18

## 2018-10-07 NOTE — Telephone Encounter (Signed)
mychart message sent

## 2018-10-19 ENCOUNTER — Other Ambulatory Visit: Payer: Self-pay | Admitting: Family Medicine

## 2018-10-19 DIAGNOSIS — F419 Anxiety disorder, unspecified: Secondary | ICD-10-CM

## 2018-10-20 NOTE — Telephone Encounter (Signed)
Left detailed message per dpr  

## 2018-10-23 ENCOUNTER — Other Ambulatory Visit: Payer: Self-pay | Admitting: Family Medicine

## 2018-10-23 DIAGNOSIS — F419 Anxiety disorder, unspecified: Secondary | ICD-10-CM

## 2018-10-23 DIAGNOSIS — I10 Essential (primary) hypertension: Secondary | ICD-10-CM

## 2018-11-01 ENCOUNTER — Telehealth (INDEPENDENT_AMBULATORY_CARE_PROVIDER_SITE_OTHER): Payer: Self-pay | Admitting: *Deleted

## 2018-11-01 ENCOUNTER — Encounter (INDEPENDENT_AMBULATORY_CARE_PROVIDER_SITE_OTHER): Payer: Self-pay | Admitting: *Deleted

## 2018-11-01 DIAGNOSIS — Z1211 Encounter for screening for malignant neoplasm of colon: Secondary | ICD-10-CM

## 2018-11-01 MED ORDER — SUPREP BOWEL PREP KIT 17.5-3.13-1.6 GM/177ML PO SOLN: 1.0000 | Freq: Once | ORAL | 0 refills | Status: AC

## 2018-11-01 NOTE — Telephone Encounter (Signed)
Patient needs suprep TCS sch'd 11/19

## 2018-11-17 ENCOUNTER — Telehealth (INDEPENDENT_AMBULATORY_CARE_PROVIDER_SITE_OTHER): Payer: Self-pay | Admitting: *Deleted

## 2018-11-17 ENCOUNTER — Other Ambulatory Visit: Payer: Self-pay

## 2018-11-17 ENCOUNTER — Other Ambulatory Visit: Payer: Self-pay | Admitting: Family Medicine

## 2018-11-17 ENCOUNTER — Ambulatory Visit (INDEPENDENT_AMBULATORY_CARE_PROVIDER_SITE_OTHER): Payer: Self-pay

## 2018-11-17 DIAGNOSIS — I10 Essential (primary) hypertension: Secondary | ICD-10-CM

## 2018-11-17 DIAGNOSIS — F419 Anxiety disorder, unspecified: Secondary | ICD-10-CM

## 2018-11-17 NOTE — Telephone Encounter (Signed)
Referring MD/PCP: dettinger   Procedure: tcs  Reason/Indication:  screening  Has patient had this procedure before?  no  If so, when, by whom and where?    Is there a family history of colon cancer?  no  Who?  What age when diagnosed?    Is patient diabetic?   no      Does patient have prosthetic heart valve or mechanical valve?  no  Do you have a pacemaker/defibrillator?  no  Has patient ever had endocarditis/atrial fibrillation? no  Does patient use oxygen? no  Has patient had joint replacement within last 12 months?  no  Is patient constipated or do they take laxatives? no  Does patient have a history of alcohol/drug use?  no  Is patient on blood thinner such as Coumadin, Plavix and/or Aspirin? no  Medications: lisinopril/hctz 20/12.5 mg daily, paroxetine 20 mg daily  08/07/18 NO NEED FOR PROPOFOL PER DR REHMAN  Allergies: nkda  Medication Adjustment per Dr Laural Golden:   Procedure date & time: 12/15/18 at 930

## 2018-11-28 ENCOUNTER — Other Ambulatory Visit (INDEPENDENT_AMBULATORY_CARE_PROVIDER_SITE_OTHER): Payer: Self-pay | Admitting: *Deleted

## 2018-12-01 ENCOUNTER — Other Ambulatory Visit: Payer: Self-pay

## 2018-12-02 ENCOUNTER — Encounter: Payer: Self-pay | Admitting: Family Medicine

## 2018-12-02 ENCOUNTER — Ambulatory Visit (INDEPENDENT_AMBULATORY_CARE_PROVIDER_SITE_OTHER): Payer: Medicare Other | Admitting: Family Medicine

## 2018-12-02 ENCOUNTER — Other Ambulatory Visit: Payer: Self-pay

## 2018-12-02 DIAGNOSIS — F419 Anxiety disorder, unspecified: Secondary | ICD-10-CM | POA: Diagnosis not present

## 2018-12-02 DIAGNOSIS — Z23 Encounter for immunization: Secondary | ICD-10-CM

## 2018-12-02 DIAGNOSIS — I10 Essential (primary) hypertension: Secondary | ICD-10-CM

## 2018-12-02 MED ORDER — PAROXETINE HCL 20 MG PO TABS: 20.0000 mg | ORAL_TABLET | Freq: Every day | ORAL | 3 refills | Status: DC

## 2018-12-02 MED ORDER — LISINOPRIL-HYDROCHLOROTHIAZIDE 20-12.5 MG PO TABS: 1.0000 | ORAL_TABLET | Freq: Every day | ORAL | 3 refills | Status: DC

## 2018-12-02 NOTE — Progress Notes (Signed)
BP 133/87   Pulse 73   Temp 98.7 F (37.1 C) (Temporal)   Ht '6\' 2"'$  (1.88 m)   Wt (!) 308 lb (139.7 kg)   SpO2 90%   BMI 39.54 kg/m    Subjective:   Patient ID: Jeremy Bennett, male    DOB: 07-27-1955, 63 y.o.   MRN: [id number]  HPI: Jeremy Bennett is a 63 y.o. male presenting on 12/02/2018 for Hypertension (check up)   HPI Hypertension Patient is currently on lisinopril-hydrochlorothiazide, and their blood pressure today is 133/87. Patient denies any lightheadedness or dizziness. Patient denies headaches, blurred vision, chest pains, shortness of breath, or weakness. Denies any side effects from medication and is content with current medication.   Anxiety recheck Patient is coming in today for anxiety recheck.  He currently takes Paxil and feels like things have been going very well on the Paxil and denies any suicidal ideations or thoughts of hurting himself.  He would like to continue on it and he feels like it is been helping him a lot through the coronavirus.  He says his wife notices a lot when he does not take it but he does not notice as much.  Relevant past medical, surgical, family and social history reviewed and updated as indicated. Interim medical history since our last visit reviewed. Allergies and medications reviewed and updated.  Review of Systems  Constitutional: Negative for chills and fever.  Eyes: Negative for discharge.  Respiratory: Negative for shortness of breath and wheezing.   Cardiovascular: Negative for chest pain and leg swelling.  Musculoskeletal: Negative for back pain and gait problem.  Skin: Negative for rash.  Neurological: Negative for dizziness, weakness, light-headedness and numbness.  All other systems reviewed and are negative.   Per HPI unless specifically indicated above   Allergies as of 12/02/2018      Reactions   No Known Allergies       Medication List       Accurate as of December 02, 2018  1:54 PM. If you have any  questions, ask your nurse or doctor.        STOP taking these medications   docusate sodium 100 MG capsule Commonly known as: COLACE Stopped by: Fransisca Kaufmann Fynn Adel, MD     TAKE these medications   lisinopril-hydrochlorothiazide 20-12.5 MG tablet Commonly known as: ZESTORETIC Take 1 tablet by mouth daily.   PARoxetine 20 MG tablet Commonly known as: PAXIL Take 1 tablet (20 mg total) by mouth daily.   triamcinolone cream 0.1 % Commonly known as: KENALOG Apply 1 application topically 2 (two) times daily as needed (rash).        Objective:   BP 133/87   Pulse 73   Temp 98.7 F (37.1 C) (Temporal)   Ht '6\' 2"'$  (1.88 m)   Wt (!) 308 lb (139.7 kg)   SpO2 90%   BMI 39.54 kg/m   Wt Readings from Last 3 Encounters:  12/02/18 (!) 308 lb (139.7 kg)  02/23/18 (!) 303 lb 9.6 oz (137.7 kg)  10/06/16 295 lb (133.8 kg)    Physical Exam Vitals signs and nursing note reviewed.  Constitutional:      General: He is not in acute distress.    Appearance: He is well-developed. He is not diaphoretic.  Eyes:     General: No scleral icterus.    Conjunctiva/sclera: Conjunctivae normal.  Neck:     Musculoskeletal: Neck supple.     Thyroid: No thyromegaly.  Cardiovascular:  Rate and Rhythm: Normal rate and regular rhythm.     Heart sounds: Normal heart sounds. No murmur.  Pulmonary:     Effort: Pulmonary effort is normal. No respiratory distress.     Breath sounds: Normal breath sounds. No wheezing.  Musculoskeletal: Normal range of motion.  Lymphadenopathy:     Cervical: No cervical adenopathy.  Skin:    General: Skin is warm and dry.     Findings: No rash.  Neurological:     Mental Status: He is alert and oriented to person, place, and time.     Coordination: Coordination normal.  Psychiatric:        Behavior: Behavior normal.        Assessment & Plan:   Problem List Items Addressed This Visit      Cardiovascular and Mediastinum   Hypertension   Relevant  Medications   lisinopril-hydrochlorothiazide (ZESTORETIC) 20-12.5 MG tablet   Other Relevant Orders   CMP14+EGFR   Lipid panel     Other   Anxiety   Relevant Medications   PARoxetine (PAXIL) 20 MG tablet      Continue Paxil and lisinopril hydrochlorothiazide Follow up plan: Return in about 6 months (around 06/01/2019), or if symptoms worsen or fail to improve, for Physical.  Counseling provided for all of the vaccine components No orders of the defined types were placed in this encounter.   Caryl Pina, MD [facility] 12/02/2018, 1:54 PM

## 2018-12-03 LAB — LIPID PANEL
Chol/HDL Ratio: 5.2 ratio — ABNORMAL HIGH (ref 0.0–5.0)
Cholesterol, Total: 213 mg/dL — ABNORMAL HIGH (ref 100–199)
HDL: 41 mg/dL (ref >39)
LDL Chol Calc (NIH): 115 mg/dL — ABNORMAL HIGH (ref 0–99)
Triglycerides: 328 mg/dL — ABNORMAL HIGH (ref 0–149)
VLDL Cholesterol Cal: 57 mg/dL — ABNORMAL HIGH (ref 5–40)

## 2018-12-03 LAB — CMP14+EGFR
ALT: 69 IU/L — ABNORMAL HIGH (ref 0–44)
AST: 57 IU/L — ABNORMAL HIGH (ref 0–40)
Albumin/Globulin Ratio: 2.1 (ref 1.2–2.2)
Albumin: 4.8 g/dL (ref 3.8–4.8)
Alkaline Phosphatase: 73 IU/L (ref 39–117)
BUN/Creatinine Ratio: 9 — ABNORMAL LOW (ref 10–24)
BUN: 9 mg/dL (ref 8–27)
Bilirubin Total: 1.5 mg/dL — ABNORMAL HIGH (ref 0.0–1.2)
CO2: 23 mmol/L (ref 20–29)
Calcium: 9.4 mg/dL (ref 8.6–10.2)
Chloride: 103 mmol/L (ref 96–106)
Creatinine, Ser: 0.95 mg/dL (ref 0.76–1.27)
GFR calc Af Amer: 99 mL/min/{1.73_m2} (ref >59)
GFR calc non Af Amer: 85 mL/min/{1.73_m2} (ref >59)
Globulin, Total: 2.3 g/dL (ref 1.5–4.5)
Glucose: 113 mg/dL — ABNORMAL HIGH (ref 65–99)
Potassium: 3.7 mmol/L (ref 3.5–5.2)
Sodium: 142 mmol/L (ref 134–144)
Total Protein: 7.1 g/dL (ref 6.0–8.5)

## 2018-12-06 ENCOUNTER — Telehealth: Payer: Self-pay | Admitting: Family Medicine

## 2018-12-06 ENCOUNTER — Other Ambulatory Visit: Payer: Self-pay | Admitting: *Deleted

## 2018-12-06 DIAGNOSIS — R7989 Other specified abnormal findings of blood chemistry: Secondary | ICD-10-CM

## 2018-12-06 NOTE — Telephone Encounter (Signed)
Aware of labs / future order placed

## 2018-12-13 ENCOUNTER — Other Ambulatory Visit: Payer: Self-pay

## 2018-12-13 ENCOUNTER — Other Ambulatory Visit (HOSPITAL_COMMUNITY)
Admission: RE | Admit: 2018-12-13 | Discharge: 2018-12-13 | Disposition: A | Discharge status: Home / Self Care | Payer: 59 | Source: Ambulatory Visit | Attending: Internal Medicine | Admitting: Internal Medicine

## 2018-12-13 DIAGNOSIS — Z20828 Contact with and (suspected) exposure to other viral communicable diseases: Secondary | ICD-10-CM | POA: Insufficient documentation

## 2018-12-13 DIAGNOSIS — Z01812 Encounter for preprocedural laboratory examination: Secondary | ICD-10-CM | POA: Insufficient documentation

## 2018-12-13 DIAGNOSIS — M1990 Unspecified osteoarthritis, unspecified site: Secondary | ICD-10-CM | POA: Diagnosis not present

## 2018-12-13 DIAGNOSIS — D123 Benign neoplasm of transverse colon: Secondary | ICD-10-CM | POA: Diagnosis not present

## 2018-12-13 DIAGNOSIS — Z1211 Encounter for screening for malignant neoplasm of colon: Secondary | ICD-10-CM | POA: Diagnosis not present

## 2018-12-13 DIAGNOSIS — G4730 Sleep apnea, unspecified: Secondary | ICD-10-CM | POA: Diagnosis not present

## 2018-12-13 DIAGNOSIS — K5730 Diverticulosis of large intestine without perforation or abscess without bleeding: Secondary | ICD-10-CM | POA: Diagnosis not present

## 2018-12-13 DIAGNOSIS — Z79899 Other long term (current) drug therapy: Secondary | ICD-10-CM | POA: Diagnosis not present

## 2018-12-13 DIAGNOSIS — Z96653 Presence of artificial knee joint, bilateral: Secondary | ICD-10-CM | POA: Diagnosis not present

## 2018-12-13 DIAGNOSIS — I10 Essential (primary) hypertension: Secondary | ICD-10-CM | POA: Diagnosis not present

## 2018-12-13 DIAGNOSIS — F988 Other specified behavioral and emotional disorders with onset usually occurring in childhood and adolescence: Secondary | ICD-10-CM | POA: Diagnosis not present

## 2018-12-13 LAB — SARS CORONAVIRUS 2 (TAT 6-24 HRS): SARS Coronavirus 2: NEGATIVE

## 2018-12-15 ENCOUNTER — Ambulatory Visit (HOSPITAL_COMMUNITY)
Admission: RE | Admit: 2018-12-15 | Discharge: 2018-12-15 | Disposition: A | Discharge status: Home / Self Care | Payer: 59 | Attending: Internal Medicine | Admitting: Internal Medicine

## 2018-12-15 ENCOUNTER — Other Ambulatory Visit: Payer: Self-pay

## 2018-12-15 ENCOUNTER — Encounter (HOSPITAL_COMMUNITY): Payer: Self-pay | Admitting: *Deleted

## 2018-12-15 ENCOUNTER — Encounter (HOSPITAL_COMMUNITY)
Admission: RE | Disposition: A | Discharge status: Home / Self Care | Payer: Self-pay | Source: Home / Self Care | Attending: Internal Medicine

## 2018-12-15 DIAGNOSIS — F988 Other specified behavioral and emotional disorders with onset usually occurring in childhood and adolescence: Secondary | ICD-10-CM | POA: Diagnosis not present

## 2018-12-15 DIAGNOSIS — Z1211 Encounter for screening for malignant neoplasm of colon: Secondary | ICD-10-CM | POA: Insufficient documentation

## 2018-12-15 DIAGNOSIS — D123 Benign neoplasm of transverse colon: Secondary | ICD-10-CM | POA: Diagnosis not present

## 2018-12-15 DIAGNOSIS — G4730 Sleep apnea, unspecified: Secondary | ICD-10-CM | POA: Diagnosis not present

## 2018-12-15 DIAGNOSIS — Z96653 Presence of artificial knee joint, bilateral: Secondary | ICD-10-CM | POA: Insufficient documentation

## 2018-12-15 DIAGNOSIS — D120 Benign neoplasm of cecum: Secondary | ICD-10-CM | POA: Diagnosis not present

## 2018-12-15 DIAGNOSIS — I10 Essential (primary) hypertension: Secondary | ICD-10-CM | POA: Insufficient documentation

## 2018-12-15 DIAGNOSIS — M1990 Unspecified osteoarthritis, unspecified site: Secondary | ICD-10-CM | POA: Insufficient documentation

## 2018-12-15 DIAGNOSIS — Z79899 Other long term (current) drug therapy: Secondary | ICD-10-CM | POA: Insufficient documentation

## 2018-12-15 DIAGNOSIS — K5730 Diverticulosis of large intestine without perforation or abscess without bleeding: Secondary | ICD-10-CM | POA: Diagnosis not present

## 2018-12-15 HISTORY — PX: POLYPECTOMY: SHX5525

## 2018-12-15 HISTORY — PX: COLONOSCOPY: SHX5424

## 2018-12-15 SURGERY — COLONOSCOPY
Anesthesia: Moderate Sedation

## 2018-12-15 MED ORDER — MEPERIDINE HCL 50 MG/ML IJ SOLN
INTRAMUSCULAR | Status: DC | PRN
  Administered 2018-12-15 (×3): 25 mg

## 2018-12-15 MED ORDER — MIDAZOLAM HCL 5 MG/5ML IJ SOLN
INTRAMUSCULAR | Status: AC
  Filled 2018-12-15: qty 10

## 2018-12-15 MED ORDER — STERILE WATER FOR IRRIGATION IR SOLN
Status: DC | PRN
  Administered 2018-12-15: 09:00:00 1.5 mL

## 2018-12-15 MED ORDER — MIDAZOLAM HCL 5 MG/5ML IJ SOLN
INTRAMUSCULAR | Status: DC | PRN
  Administered 2018-12-15: 2 mg via INTRAVENOUS
  Administered 2018-12-15: 3 mg via INTRAVENOUS
  Administered 2018-12-15 (×2): 2 mg via INTRAVENOUS

## 2018-12-15 MED ORDER — MEPERIDINE HCL 50 MG/ML IJ SOLN
INTRAMUSCULAR | Status: AC
  Filled 2018-12-15: qty 1

## 2018-12-15 MED ORDER — SODIUM CHLORIDE 0.9 % IV SOLN
INTRAVENOUS | Status: DC
  Administered 2018-12-15: 09:00:00 via INTRAVENOUS

## 2018-12-15 NOTE — Op Note (Signed)
[facility] Patient Name: Jeremy Bennett Procedure Date: 12/15/2018 8:58 AM MRN: [id number] Date of Birth: 1955-09-05 Attending MD: Hildred Laser , MD CSN: [id number] Age: 63 Admit Type: Outpatient Procedure:                Colonoscopy Indications:              Screening for colorectal malignant neoplasm Providers:                Hildred Laser, MD, Gerome Sam, RN, Nelma Rothman,                            Technician Referring MD:             Fransisca Kaufmann. Dettinger, MD Medicines:                Meperidine 75 mg IV, Midazolam 9 mg IV Complications:            No immediate complications. Estimated Blood Loss:     Estimated blood loss was minimal. Procedure:                Pre-Anesthesia Assessment:                           - Prior to the procedure, a History and Physical                            was performed, and patient medications and                            allergies were reviewed. The patient's tolerance of                            previous anesthesia was also reviewed. The risks                            and benefits of the procedure and the sedation                            options and risks were discussed with the patient.                            All questions were answered, and informed consent                            was obtained. Prior Anticoagulants: The patient has                            taken no previous anticoagulant or antiplatelet                            agents. ASA Grade Assessment: II - A patient with                            mild systemic disease. After reviewing the risks  and benefits, the patient was deemed in                            satisfactory condition to undergo the procedure.                           After obtaining informed consent, the colonoscope                            was passed under direct vision. Throughout the                            procedure, the patient's blood pressure, pulse, and                             oxygen saturations were monitored continuously. The                            [id number] scope was introduced through                            the anus and advanced to the the cecum, identified                            by appendiceal orifice and ileocecal valve. The                            colonoscopy was performed without difficulty. The                            patient tolerated the procedure well. The quality                            of the bowel preparation was good except the cecum                            was fair. The ileocecal valve, appendiceal orifice,                            and rectum were photographed. Scope In: 9:19:39 AM Scope Out: 9:41:46 AM Scope Withdrawal Time: 0 hours 18 minutes 42 seconds  Total Procedure Duration: 0 hours 22 minutes 7 seconds  Findings:      The perianal and digital rectal examinations were normal.      A small polyp was found in the transverse colon. The polyp was sessile.       The polyp was removed with a cold snare. Resection and retrieval were       complete. The pathology specimen was placed into Bottle Number 1.      Two polyps were found in the transverse colon. The polyps were small in       size. These were biopsied with a cold forceps for histology. The       pathology specimen was placed into Bottle Number 1.      A few small-mouthed diverticula were found in  the sigmoid colon.      The retroflexed view of the distal rectum and anal verge was normal and       showed no anal or rectal abnormalities. Impression:               - One small polyp in the transverse colon, removed                            with a cold snare. Resected and retrieved.                           - Two small polyps in the transverse colon.                            Biopsied.                           - Diverticulosis in the sigmoid colon.                           - The distal rectum and anal verge are normal on                             retroflexion view. Moderate Sedation:      Moderate (conscious) sedation was administered by the endoscopy nurse       and supervised by the endoscopist. The following parameters were       monitored: oxygen saturation, heart rate, blood pressure, CO2       capnography and response to care. Total physician intraservice time was       30 minutes. Recommendation:           - Patient has a contact number available for                            emergencies. The signs and symptoms of potential                            delayed complications were discussed with the                            patient. Return to normal activities tomorrow.                            Written discharge instructions were provided to the                            patient.                           - High fiber diet today.                           - Continue present medications.                           - No aspirin, ibuprofen, naproxen, or other  non-steroidal anti-inflammatory drugs for 1 day.                           - Await pathology results.                           - Repeat colonoscopy is recommended. The                            colonoscopy date will be determined after pathology                            results from today's exam become available for                            review. Procedure Code(s):        --- Professional ---                           [phone], Colonoscopy, flexible; with removal of                            tumor(s), polyp(s), or other lesion(s) by snare                            technique                           45380, 59, Colonoscopy, flexible; with biopsy,                            single or multiple                           99153, Moderate sedation; each additional 15                            minutes intraservice time                           G0500, Moderate sedation services provided by the                             same physician or other qualified health care                            professional performing a gastrointestinal                            endoscopic service that sedation supports,                            requiring the presence of an independent trained                            observer to assist in the monitoring of the  patient's level of consciousness and physiological                            status; initial 15 minutes of intra-service time;                            patient age 91 years or older (additional time may                            be reported with [phone], as appropriate) Diagnosis Code(s):        --- Professional ---                           K63.5, Polyp of colon                           Z12.11, Encounter for screening for malignant                            neoplasm of colon                           K57.30, Diverticulosis of large intestine without                            perforation or abscess without bleeding CPT copyright 2019 American Medical Association. All rights reserved. The codes documented in this report are preliminary and upon coder review may  be revised to meet current compliance requirements. Hildred Laser, MD Hildred Laser, MD 12/15/2018 9:49:52 AM This report has been signed electronically. Number of Addenda: 0

## 2018-12-15 NOTE — H&P (Signed)
Jeremy Bennett is an 63 y.o. Bennett.   Chief Complaint: Jeremy Bennett is here for colonoscopy. HPI: Jeremy Bennett who is here for average her screening colonoscopy.  Last exam was 10 years ago and was normal.  He denies abdominal pain change in bowel habits or rectal bleeding.  He does not take NSAIDs or aspirin. Family history is negative for CRC.  Past Medical History:  Diagnosis Date  . ADD (attention deficit disorder)   . Arthritis   . Gout   . History of asbestosis   . Hypertension   . Sleep apnea    tested more than 5 yrs ago    Past Surgical History:  Procedure Laterality Date  . COLONOSCOPY    . FRACTURE SURGERY     left clavicle   1972  . KNEE ARTHROSCOPY     right  . 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 TOTAL KNEE ARTHROPLASTY left 10/06/2016   Procedure: TOTAL KNEE ARTHROPLASTY;  Surgeon: Melrose Nakayama, MD;  Location: [facility];  Service: Orthopedics;  Laterality: Right;  . TOTAL KNEE ARTHROPLASTY Left 10/06/2016   Procedure: TOTAL KNEE ARTHROPLASTY;  Surgeon: Melrose Nakayama, MD;  Location: [facility];  Service: Orthopedics;  Laterality: Left;    Family History  Problem Relation Age of Onset  . HIV/AIDS Father    Social History:  reports that he has never smoked. He has never used smokeless tobacco. He reports current alcohol use of about 1.0 standard drinks of alcohol per week. He reports that he does not use drugs.  Allergies:  Allergies  Allergen Reactions  . No Known Allergies     Medications Prior to Admission  Medication Sig Dispense Refill  . lisinopril-hydrochlorothiazide (ZESTORETIC) 20-12.5 MG tablet Take 1 tablet by mouth daily. 90 tablet 3  . PARoxetine (PAXIL) 20 MG tablet Take 1 tablet (20 mg total) by mouth daily. 90 tablet 3  . Polyethyl Glycol-Propyl Glycol (SYSTANE) 0.4-0.3 % SOLN Place 1 drop into both eyes daily as needed (Dry eyes).    . triamcinolone cream (KENALOG) 0.1 % Apply 1 application topically 2 (two) times daily as  needed (rash). (Jeremy Bennett not taking: Reported on 12/06/2018) 454 g 2    No results found for this or any previous visit (from the past 48 hour(s)). No results found.  ROS  Blood pressure 122/79, pulse 68, temperature 98.6 F (37 C), resp. rate 11, SpO2 97 %. Physical Exam  Constitutional: He appears well-developed and well-nourished.  HENT:  Mouth/Throat: Oropharynx is clear and moist.  Eyes: Conjunctivae are normal. No scleral icterus.  Neck: No thyromegaly present.  Cardiovascular: Normal rate, regular rhythm and normal heart sounds.  No murmur heard. Respiratory: Effort normal and breath sounds normal.  GI:  Abdomen is full.  Soft and nontender with organomegaly or masses.  Musculoskeletal:        General: No edema.  Lymphadenopathy:    He has no cervical adenopathy.  Neurological: He is alert.  Skin: Skin is warm and dry.     Assessment/Plan Average risk screening colonoscopy.  Jeremy Laser, MD 12/15/2018, 9:05 AM

## 2018-12-15 NOTE — Discharge Instructions (Signed)
No aspirin or NSAIDs for 24 hours. Resume usual medications as before. High-fiber diet. No driving for 24 hours. Physician will call with biopsy results.   Colonoscopy, Adult, Care After This sheet gives you information about how to care for yourself after your procedure. Your doctor may also give you more specific instructions. If you have problems or questions, call your doctor. What can I expect after the procedure? After the procedure, it is common to have:  A small amount of blood in your poop for 24 hours.  Some gas.  Mild cramping or bloating in your belly. Follow these instructions at home: General instructions  For the first 24 hours after the procedure: ? Do not drive or use machinery. ? Do not sign important documents. ? Do not drink alcohol. ? Do your daily activities more slowly than normal. ? Eat foods that are soft and easy to digest.  Take over-the-counter or prescription medicines only as told by your doctor. To help cramping and bloating:   Try walking around.  Put heat on your belly (abdomen) as told by your doctor. Use a heat source that your doctor recommends, such as a moist heat pack or a heating pad. ? Put a towel between your skin and the heat source. ? Leave the heat on for 20-30 minutes. ? Remove the heat if your skin turns bright red. This is especially important if you cannot feel pain, heat, or cold. You can get burned. Eating and drinking   Drink enough fluid to keep your pee (urine) clear or pale yellow.  Return to your normal diet as told by your doctor. Avoid heavy or fried foods that are hard to digest.  Avoid drinking alcohol for as long as told by your doctor. Contact a doctor if:  You have blood in your poop (stool) 2-3 days after the procedure. Get help right away if:  You have more than a small amount of blood in your poop.  You see large clumps of tissue (blood clots) in your poop.  Your belly is swollen.  You feel sick  to your stomach (nauseous).  You throw up (vomit).  You have a fever.  You have belly pain that gets worse, and medicine does not help your pain. Summary  After the procedure, it is common to have a small amount of blood in your poop. You may also have mild cramping and bloating in your belly.  For the first 24 hours after the procedure, do not drive or use machinery, do not sign important documents, and do not drink alcohol.  Get help right away if you have a lot of blood in your poop, feel sick to your stomach, have a fever, or have more belly pain. This information is not intended to replace advice given to you by your health care provider. Make sure you discuss any questions you have with your health care provider. Document Released: 02/14/2010 Document Revised: 11/12/2016 Document Reviewed: 10/07/2015 Elsevier Patient Education  2020 [place].  Colon Polyps  Polyps are tissue growths inside the body. Polyps can grow in many places, including the large intestine (colon). A polyp may be a round bump or a mushroom-shaped growth. You could have one polyp or several. Most colon polyps are noncancerous (benign). However, some colon polyps can become cancerous over time. Finding and removing the polyps early can help prevent this. What are the causes? The exact cause of colon polyps is not known. What increases the risk? You are more likely  to develop this condition if you:  Have a family history of colon cancer or colon polyps.  Are older than 29 or older than 45 if you are African American.  Have inflammatory bowel disease, such as ulcerative colitis or Crohn's disease.  Have certain hereditary conditions, such as: ? Familial adenomatous polyposis. ? Lynch syndrome. ? [place] syndrome. ? Peutz-Jeghers syndrome.  Are overweight.  Smoke cigarettes.  Do not get enough exercise.  Drink too much alcohol.  Eat a diet that is high in fat and red meat and low in  fiber.  Had childhood cancer that was treated with abdominal radiation. What are the signs or symptoms? Most polyps do not cause symptoms. If you have symptoms, they may include:  Blood coming from your rectum when having a bowel movement.  Blood in your stool. The stool may look dark red or black.  Abdominal pain.  A change in bowel habits, such as constipation or diarrhea. How is this diagnosed? This condition is diagnosed with a colonoscopy. This is a procedure in which a lighted, flexible scope is inserted into the anus and then passed into the colon to examine the area. Polyps are sometimes found when a colonoscopy is done as part of routine cancer screening tests. How is this treated? Treatment for this condition involves removing any polyps that are found. Most polyps can be removed during a colonoscopy. Those polyps will then be tested for cancer. Additional treatment may be needed depending on the results of testing. Follow these instructions at home: Lifestyle  Maintain a healthy weight, or lose weight if recommended by your health care provider.  Exercise every day or as told by your health care provider.  Do not use any products that contain nicotine or tobacco, such as cigarettes and e-cigarettes. If you need help quitting, ask your health care provider.  If you drink alcohol, limit how much you have: ? 0-1 drink a day for women. ? 0-2 drinks a day for men.  Be aware of how much alcohol is in your drink. In the [place] one drink equals one 12 oz bottle of beer (355 mL), one 5 oz glass of wine (148 mL), or one 1 oz shot of hard liquor (44 mL). Eating and drinking   Eat foods that are high in fiber, such as fruits, vegetables, and whole grains.  Eat foods that are high in calcium and vitamin D, such as milk, cheese, yogurt, eggs, liver, fish, and broccoli.  Limit foods that are high in fat, such as fried foods and desserts.  Limit the amount of red meat and  processed meat you eat, such as hot dogs, sausage, bacon, and lunch meats. General instructions  Keep all follow-up visits as told by your health care provider. This is important. ? This includes having regularly scheduled colonoscopies. ? Talk to your health care provider about when you need a colonoscopy. Contact a health care provider if:  You have new or worsening bleeding during a bowel movement.  You have new or increased blood in your stool.  You have a change in bowel habits.  You lose weight for no known reason. Summary  Polyps are tissue growths inside the body. Polyps can grow in many places, including the colon.  Most colon polyps are noncancerous (benign), but some can become cancerous over time.  This condition is diagnosed with a colonoscopy.  Treatment for this condition involves removing any polyps that are found. Most polyps can be removed during a colonoscopy.  This information is not intended to replace advice given to you by your health care provider. Make sure you discuss any questions you have with your health care provider. Document Released: 10/09/2003 Document Revised: 04/29/2017 Document Reviewed: 04/29/2017 Elsevier Patient Education  [place].  High-Fiber Diet Fiber, also called dietary fiber, is a type of carbohydrate that is found in fruits, vegetables, whole grains, and beans. A high-fiber diet can have many health benefits. Your health care provider may recommend a high-fiber diet to help:  Prevent constipation. Fiber can make your bowel movements more regular.  Lower your cholesterol.  Relieve the following conditions: ? Swelling of veins in the anus (hemorrhoids). ? Swelling and irritation (inflammation) of specific areas of the digestive tract (uncomplicated diverticulosis). ? A problem of the large intestine (colon) that sometimes causes pain and diarrhea (irritable bowel syndrome, IBS).  Prevent overeating as part of a weight-loss  plan.  Prevent heart disease, type 2 diabetes, and certain cancers. What is my plan? The recommended daily fiber intake in grams (g) includes:  38 g for men age 38 or younger.  30 g for men over age 54.  83 g for women age 28 or younger.  21 g for women over age 35. You can get the recommended daily intake of dietary fiber by:  Eating a variety of fruits, vegetables, grains, and beans.  Taking a fiber supplement, if it is not possible to get enough fiber through your diet. What do I need to know about a high-fiber diet?  It is better to get fiber through food sources rather than from fiber supplements. There is not a lot of research about how effective supplements are.  Always check the fiber content on the nutrition facts label of any prepackaged food. Look for foods that contain 5 g of fiber or more per serving.  Talk with a diet and nutrition specialist (dietitian) if you have questions about specific foods that are recommended or not recommended for your medical condition, especially if those foods are not listed below.  Gradually increase how much fiber you consume. If you increase your intake of dietary fiber too quickly, you may have bloating, cramping, or gas.  Drink plenty of water. Water helps you to digest fiber. What are tips for following this plan?  Eat a wide variety of high-fiber foods.  Make sure that half of the grains that you eat each day are whole grains.  Eat breads and cereals that are made with whole-grain flour instead of refined flour or white flour.  Eat brown rice, bulgur wheat, or millet instead of white rice.  Start the day with a breakfast that is high in fiber, such as a cereal that contains 5 g of fiber or more per serving.  Use beans in place of meat in soups, salads, and pasta dishes.  Eat high-fiber snacks, such as berries, raw vegetables, nuts, and popcorn.  Choose whole fruits and vegetables instead of processed forms like juice or  sauce. What foods can I eat?  Fruits Berries. Pears. Apples. Oranges. Avocado. Prunes and raisins. Dried figs. Vegetables Sweet potatoes. Spinach. Kale. Artichokes. Cabbage. Broccoli. Cauliflower. Green peas. Carrots. Squash. Grains Whole-grain breads. Multigrain cereal. Oats and oatmeal. Brown rice. Barley. Bulgur wheat. [place]. Quinoa. Bran muffins. Popcorn. Rye wafer crackers. Meats and other proteins [place], kidney, and pinto beans. Soybeans. Split peas. Lentils. Nuts and seeds. Dairy Fiber-fortified yogurt. Beverages Fiber-fortified soy milk. Fiber-fortified orange juice. Other foods Fiber bars. The items listed above may  not be a complete list of recommended foods and beverages. Contact a dietitian for more options. What foods are not recommended? Fruits Fruit juice. Cooked, strained fruit. Vegetables Fried potatoes. Canned vegetables. Well-cooked vegetables. Grains White bread. Pasta made with refined flour. White rice. Meats and other proteins Fatty cuts of meat. Fried chicken or fried fish. Dairy Milk. Yogurt. Cream cheese. Sour cream. Fats and oils Butters. Beverages Soft drinks. Other foods Cakes and pastries. The items listed above may not be a complete list of foods and beverages to avoid. Contact a dietitian for more information. Summary  Fiber is a type of carbohydrate. It is found in fruits, vegetables, whole grains, and beans.  There are many health benefits of eating a high-fiber diet, such as preventing constipation, lowering blood cholesterol, helping with weight loss, and reducing your risk of heart disease, diabetes, and certain cancers.  Gradually increase your intake of fiber. Increasing too fast can result in cramping, bloating, and gas. Drink plenty of water while you increase your fiber.  The best sources of fiber include whole fruits and vegetables, whole grains, nuts, seeds, and beans. This information is not intended to replace advice given to  you by your health care provider. Make sure you discuss any questions you have with your health care provider. Document Released: 01/12/2005 Document Revised: 11/16/2016 Document Reviewed: 11/16/2016 Elsevier Patient Education  2020 [employer].

## 2018-12-16 LAB — SURGICAL PATHOLOGY

## 2018-12-19 ENCOUNTER — Encounter (HOSPITAL_COMMUNITY): Payer: Self-pay | Admitting: Internal Medicine

## 2018-12-29 ENCOUNTER — Other Ambulatory Visit: Payer: Medicare Other

## 2018-12-29 DIAGNOSIS — R7989 Other specified abnormal findings of blood chemistry: Secondary | ICD-10-CM | POA: Diagnosis not present

## 2018-12-29 NOTE — Addendum Note (Signed)
Addended by: Earlene Plater on: 12/29/2018 11:36 AM   Modules accepted: Orders

## 2018-12-30 LAB — HEPATIC FUNCTION PANEL
ALT: 48 IU/L — ABNORMAL HIGH (ref 0–44)
AST: 32 IU/L (ref 0–40)
Albumin: 4.6 g/dL (ref 3.8–4.8)
Alkaline Phosphatase: 76 IU/L (ref 39–117)
Bilirubin Total: 1 mg/dL (ref 0.0–1.2)
Bilirubin, Direct: 0.2 mg/dL (ref 0.00–0.40)
Total Protein: 6.9 g/dL (ref 6.0–8.5)

## 2019-02-28 ENCOUNTER — Other Ambulatory Visit: Payer: Self-pay

## 2019-02-28 NOTE — Patient Outreach (Signed)
[facility]) Care Management  02/28/2019  Jeremy Bennett 10-07-1955 [id number]   Medication Adherence call to Jeremy Bennett HIPPA Compliant Voice message left with a call back number. Jeremy Bennett is showing past due on Lisinopril 40 mg under [facility].   [facility] Management Direct Dial [phone]  Fax [phone] [email or web address]

## 2019-03-08 ENCOUNTER — Encounter (INDEPENDENT_AMBULATORY_CARE_PROVIDER_SITE_OTHER): Payer: Self-pay

## 2019-04-05 ENCOUNTER — Encounter: Payer: Self-pay | Admitting: Family Medicine

## 2019-04-05 ENCOUNTER — Ambulatory Visit (INDEPENDENT_AMBULATORY_CARE_PROVIDER_SITE_OTHER): Payer: Medicare Other | Admitting: Family Medicine

## 2019-04-05 DIAGNOSIS — M10071 Idiopathic gout, right ankle and foot: Secondary | ICD-10-CM | POA: Diagnosis not present

## 2019-04-05 MED ORDER — TRAMADOL HCL 50 MG PO TABS: 50.0000 mg | ORAL_TABLET | Freq: Four times a day (QID) | ORAL | 0 refills | Status: AC

## 2019-04-05 MED ORDER — COLCHICINE 0.6 MG PO TABS: ORAL_TABLET | ORAL | 2 refills | Status: DC

## 2019-04-05 NOTE — Progress Notes (Signed)
Subjective:    Patient ID: Jeremy Bennett, male    DOB: 09-10-1955, 64 y.o.   MRN: [id number]   HPI: Jeremy Bennett is a 64 y.o. male presenting for 2 days of severe pain in the right ankle. Swollen. Unable to sleep the last two night s as a result of the pain. Feels like when he had gout in his toes. Hasn't been in the ankle before though. Responded in the past to colcrys.    Depression screen [facility] 2/9 12/02/2018 02/23/2018  Decreased Interest 0 0  Down, Depressed, Hopeless 0 0  PHQ - 2 Score 0 0     Relevant past medical, surgical, family and social history reviewed and updated as indicated.  Interim medical history since our last visit reviewed. Allergies and medications reviewed and updated.  ROS:  Review of Systems  Constitutional: Negative for fever.  Respiratory: Negative for shortness of breath.   Cardiovascular: Negative for chest pain.  Musculoskeletal: Negative for arthralgias.  Skin: Negative for rash.     Social History   Tobacco Use  Smoking Status Never Smoker  Smokeless Tobacco Never Used       Objective:     Wt Readings from Last 3 Encounters:  12/02/18 (!) 308 lb (139.7 kg)  02/23/18 (!) 303 lb 9.6 oz (137.7 kg)  10/06/16 295 lb (133.8 kg)     Exam deferred. Pt. Harboring due to COVID 19. Phone visit performed.   Assessment & Plan:   1. Acute idiopathic gout of right ankle     Meds ordered this encounter  Medications  . colchicine 0.6 MG tablet    Sig: For acute attack of gout use one at onset, then one every two hours until 6 pills have been taken. Stop sooner if pain is relieved or if nausea occurs. Then revert to BID    Dispense:  60 tablet    Refill:  2  . traMADol (ULTRAM) 50 MG tablet    Sig: Take 1 tablet (50 mg total) by mouth 4 (four) times daily for 5 days. 1-2 tablets up to 4 times a day as needed for pain    Dispense:  20 tablet    Refill:  0    Diagnoses and all orders for this visit:  Acute idiopathic gout of right  ankle  Other orders -     colchicine 0.6 MG tablet; For acute attack of gout use one at onset, then one every two hours until 6 pills have been taken. Stop sooner if pain is relieved or if nausea occurs. Then revert to BID -     traMADol (ULTRAM) 50 MG tablet; Take 1 tablet (50 mg total) by mouth 4 (four) times daily for 5 days. 1-2 tablets up to 4 times a day as needed for pain    Virtual Visit via telephone Note  I discussed the limitations, risks, security and privacy concerns of performing an evaluation and management service by telephone and the availability of in person appointments. The patient was identified with two identifiers. Pt.expressed understanding and agreed to proceed. Pt. Is at home. Dr. Livia Snellen is in his office.  Follow Up Instructions:   I discussed the assessment and treatment plan with the patient. The patient was provided an opportunity to ask questions and all were answered. The patient agreed with the plan and demonstrated an understanding of the instructions.   The patient was advised to call back or seek an in-person evaluation if the symptoms worsen  or if the condition fails to improve as anticipated.   Total minutes including chart review and phone contact time: 11   Follow up plan: Return if symptoms worsen or fail to improve.  Claretta Fraise, MD [place]

## 2019-06-15 ENCOUNTER — Ambulatory Visit (INDEPENDENT_AMBULATORY_CARE_PROVIDER_SITE_OTHER): Payer: Medicare Other | Admitting: Family Medicine

## 2019-06-15 ENCOUNTER — Encounter: Payer: Self-pay | Admitting: Family Medicine

## 2019-06-15 DIAGNOSIS — F419 Anxiety disorder, unspecified: Secondary | ICD-10-CM

## 2019-06-15 DIAGNOSIS — M1711 Unilateral primary osteoarthritis, right knee: Secondary | ICD-10-CM | POA: Diagnosis not present

## 2019-06-15 DIAGNOSIS — I10 Essential (primary) hypertension: Secondary | ICD-10-CM

## 2019-06-15 DIAGNOSIS — M10071 Idiopathic gout, right ankle and foot: Secondary | ICD-10-CM | POA: Diagnosis not present

## 2019-06-15 MED ORDER — PREDNISONE 20 MG PO TABS: ORAL_TABLET | ORAL | 0 refills | Status: DC

## 2019-06-15 MED ORDER — LISINOPRIL-HYDROCHLOROTHIAZIDE 20-12.5 MG PO TABS: 1.0000 | ORAL_TABLET | Freq: Every day | ORAL | 3 refills | Status: DC

## 2019-06-15 NOTE — Progress Notes (Signed)
Virtual Visit via telephone Note  I connected with Jeremy Bennett on 06/15/19 at 1112 by telephone and verified that I am speaking with the correct person using two identifiers. Jeremy Bennett is currently located at home and no other people are currently with her during visit. The provider, Fransisca Kaufmann Tannor Pyon, MD is located in their office at time of visit.  Call ended at 1125  I discussed the limitations, risks, security and privacy concerns of performing an evaluation and management service by telephone and the availability of in person appointments. I also discussed with the patient that there may be a patient responsible charge related to this service. The patient expressed understanding and agreed to proceed.   History and Present Illness: Hypertension Patient is currently on lisinopril-hctz, and their blood pressure today is unknown. Patient denies any lightheadedness or dizziness. Patient denies headaches, blurred vision, chest pains, shortness of breath, or weakness. Denies any side effects from medication and is content with current medication.   Anxiety  Patient currently taking Paxil and feels like it is going well. He denies any suicidal ideations  Patient is still having gout attack and colchicine, it helped some and is still flared up in right foot and ankle.  He is still having something in his foot and ankle.  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 He has paperwork     Outpatient Encounter Medications as of 06/15/2019  Medication Sig  . colchicine 0.6 MG tablet For acute attack of gout use one at onset, then one every two hours until 6 pills have been taken. Stop sooner if pain is relieved or if nausea occurs. Then revert to BID  . lisinopril-hydrochlorothiazide (ZESTORETIC) 20-12.5 MG tablet Take 1 tablet by mouth daily.  Marland Kitchen PARoxetine (PAXIL) 20 MG tablet Take 1 tablet (20 mg total) by mouth daily.  Vladimir Faster Glycol-Propyl Glycol  (SYSTANE) 0.4-0.3 % SOLN Place 1 drop into both eyes daily as needed (Dry eyes).  . predniSONE (DELTASONE) 20 MG tablet 2 po at same time daily for 5 days  . [DISCONTINUED] lisinopril-hydrochlorothiazide (ZESTORETIC) 20-12.5 MG tablet Take 1 tablet by mouth daily.   No facility-administered encounter medications on file as of 06/15/2019.    Review of Systems  Constitutional: Negative for chills and fever.  Eyes: Negative for visual disturbance.  Respiratory: Negative for shortness of breath and wheezing.   Cardiovascular: Negative for chest pain, palpitations and leg swelling.  Musculoskeletal: Positive for arthralgias and joint swelling. Negative for back pain and gait problem.  Skin: Negative for rash.  Neurological: Negative for dizziness, weakness and light-headedness.  All other systems reviewed and are negative.   Observations/Objective: Patient sounds comfortable and in no acute   Assessment and Plan: Problem List Items Addressed This Visit      Cardiovascular and Mediastinum   Hypertension - Primary   Relevant Medications   lisinopril-hydrochlorothiazide (ZESTORETIC) 20-12.5 MG tablet     Musculoskeletal and Integument   Primary localized osteoarthritis of right knee (Chronic)   Relevant Medications   predniSONE (DELTASONE) 20 MG tablet     Other   Anxiety    Other Visit Diagnoses    Acute idiopathic gout of right ankle       Relevant Medications   predniSONE (DELTASONE) 20 MG tablet      Will give a short course of prednisone and  Continue current BP medicine, may need to switch if gout continues Follow up plan: Return in about 6  months (around 12/16/2019), or if symptoms worsen or fail to improve, for htn and arthritis.     I discussed the assessment and treatment plan with the patient. The patient was provided an opportunity to ask questions and all were answered. The patient agreed with the plan and demonstrated an understanding of the instructions.   The  patient was advised to call back or seek an in-person evaluation if the symptoms worsen or if the condition fails to improve as anticipated.  The above assessment and management plan was discussed with the patient. The patient verbalized understanding of and has agreed to the management plan. Patient is aware to call the clinic if symptoms persist or worsen. Patient is aware when to return to the clinic for a follow-up visit. Patient educated on when it is appropriate to go to the emergency department.    I provided 13 minutes of non-face-to-face time during this encounter.    Worthy Rancher, MD

## 2019-06-16 ENCOUNTER — Other Ambulatory Visit: Payer: Self-pay

## 2019-06-16 ENCOUNTER — Other Ambulatory Visit: Payer: Medicare Other

## 2019-06-16 DIAGNOSIS — F419 Anxiety disorder, unspecified: Secondary | ICD-10-CM

## 2019-06-16 DIAGNOSIS — Z029 Encounter for administrative examinations, unspecified: Secondary | ICD-10-CM

## 2019-06-16 DIAGNOSIS — M10071 Idiopathic gout, right ankle and foot: Secondary | ICD-10-CM

## 2019-06-16 DIAGNOSIS — I10 Essential (primary) hypertension: Secondary | ICD-10-CM

## 2019-06-17 LAB — CBC WITH DIFFERENTIAL/PLATELET
Basophils Absolute: 0 10*3/uL (ref 0.0–0.2)
Basos: 1 %
EOS (ABSOLUTE): 0.3 10*3/uL (ref 0.0–0.4)
Eos: 5 %
Hematocrit: 45.4 % (ref 37.5–51.0)
Hemoglobin: 15.9 g/dL (ref 13.0–17.7)
Immature Grans (Abs): 0 10*3/uL (ref 0.0–0.1)
Immature Granulocytes: 0 %
Lymphocytes Absolute: 1.5 10*3/uL (ref 0.7–3.1)
Lymphs: 26 %
MCH: 32.6 pg (ref 26.6–33.0)
MCHC: 35 g/dL (ref 31.5–35.7)
MCV: 93 fL (ref 79–97)
Monocytes Absolute: 0.4 10*3/uL (ref 0.1–0.9)
Monocytes: 6 %
Neutrophils Absolute: 3.6 10*3/uL (ref 1.4–7.0)
Neutrophils: 62 %
Platelets: 193 10*3/uL (ref 150–450)
RBC: 4.87 x10E6/uL (ref 4.14–5.80)
RDW: 12.4 % (ref 11.6–15.4)
WBC: 5.7 10*3/uL (ref 3.4–10.8)

## 2019-06-17 LAB — CMP14+EGFR
ALT: 46 IU/L — ABNORMAL HIGH (ref 0–44)
AST: 35 IU/L (ref 0–40)
Albumin/Globulin Ratio: 2 (ref 1.2–2.2)
Albumin: 4.7 g/dL (ref 3.8–4.8)
Alkaline Phosphatase: 77 IU/L (ref 48–121)
BUN/Creatinine Ratio: 12 (ref 10–24)
BUN: 11 mg/dL (ref 8–27)
Bilirubin Total: 0.9 mg/dL (ref 0.0–1.2)
CO2: 25 mmol/L (ref 20–29)
Calcium: 9.6 mg/dL (ref 8.6–10.2)
Chloride: 103 mmol/L (ref 96–106)
Creatinine, Ser: 0.91 mg/dL (ref 0.76–1.27)
GFR calc Af Amer: 104 mL/min/{1.73_m2} (ref >59)
GFR calc non Af Amer: 90 mL/min/{1.73_m2} (ref >59)
Globulin, Total: 2.4 g/dL (ref 1.5–4.5)
Glucose: 116 mg/dL — ABNORMAL HIGH (ref 65–99)
Potassium: 4.1 mmol/L (ref 3.5–5.2)
Sodium: 141 mmol/L (ref 134–144)
Total Protein: 7.1 g/dL (ref 6.0–8.5)

## 2019-06-17 LAB — LIPID PANEL
Chol/HDL Ratio: 4.8 ratio (ref 0.0–5.0)
Cholesterol, Total: 173 mg/dL (ref 100–199)
HDL: 36 mg/dL — ABNORMAL LOW (ref >39)
LDL Chol Calc (NIH): 96 mg/dL (ref 0–99)
Triglycerides: 240 mg/dL — ABNORMAL HIGH (ref 0–149)
VLDL Cholesterol Cal: 41 mg/dL — ABNORMAL HIGH (ref 5–40)

## 2019-06-17 LAB — URIC ACID: Uric Acid: 7.3 mg/dL (ref 3.8–8.4)

## 2019-06-20 ENCOUNTER — Telehealth: Payer: Self-pay

## 2019-06-20 NOTE — Telephone Encounter (Signed)
Called patient with lab results and he states that he is waiting on paperwork for disability insurance and would like a call back with an update.

## 2019-06-22 NOTE — Telephone Encounter (Signed)
Talked with patient late yesterday evening, we have not filled out this form for him before, the orthopedist has, he will see if they have a copy that he can obtain and we will fill this out the best we can.

## 2019-06-28 DIAGNOSIS — Z96653 Presence of artificial knee joint, bilateral: Secondary | ICD-10-CM | POA: Diagnosis not present

## 2019-11-17 ENCOUNTER — Telehealth: Payer: Self-pay

## 2019-11-20 NOTE — Telephone Encounter (Signed)
Pt calling back today about this rx. Does he need sleep study report to be sent? Please call back

## 2019-11-20 NOTE — Telephone Encounter (Signed)
Appt made for 01/02/20 w/ Dr. Warrick Parisian for his 6 mos & to talk about his OSA, he does not have any appts talking about this.

## 2019-12-06 ENCOUNTER — Other Ambulatory Visit: Payer: Self-pay | Admitting: Family Medicine

## 2019-12-06 DIAGNOSIS — F419 Anxiety disorder, unspecified: Secondary | ICD-10-CM

## 2020-01-02 ENCOUNTER — Encounter: Payer: Self-pay | Admitting: Family Medicine

## 2020-01-02 ENCOUNTER — Other Ambulatory Visit: Payer: Self-pay

## 2020-01-02 ENCOUNTER — Ambulatory Visit (INDEPENDENT_AMBULATORY_CARE_PROVIDER_SITE_OTHER): Payer: Medicare Other | Admitting: Family Medicine

## 2020-01-02 VITALS — BP 137/90 | HR 77 | Ht 74.0 in | Wt 311.0 lb

## 2020-01-02 DIAGNOSIS — I10 Essential (primary) hypertension: Secondary | ICD-10-CM

## 2020-01-02 DIAGNOSIS — F419 Anxiety disorder, unspecified: Secondary | ICD-10-CM

## 2020-01-02 DIAGNOSIS — E781 Pure hyperglyceridemia: Secondary | ICD-10-CM | POA: Diagnosis not present

## 2020-01-02 DIAGNOSIS — G4733 Obstructive sleep apnea (adult) (pediatric): Secondary | ICD-10-CM | POA: Diagnosis not present

## 2020-01-02 DIAGNOSIS — Z23 Encounter for immunization: Secondary | ICD-10-CM

## 2020-01-02 LAB — CMP14+EGFR
ALT: 71 IU/L — ABNORMAL HIGH (ref 0–44)
AST: 47 IU/L — ABNORMAL HIGH (ref 0–40)
Albumin/Globulin Ratio: 1.9 (ref 1.2–2.2)
Albumin: 4.9 g/dL — ABNORMAL HIGH (ref 3.8–4.8)
Alkaline Phosphatase: 84 IU/L (ref 44–121)
BUN/Creatinine Ratio: 11 (ref 10–24)
BUN: 12 mg/dL (ref 8–27)
Bilirubin Total: 1.3 mg/dL — ABNORMAL HIGH (ref 0.0–1.2)
CO2: 24 mmol/L (ref 20–29)
Calcium: 9.7 mg/dL (ref 8.6–10.2)
Chloride: 101 mmol/L (ref 96–106)
Creatinine, Ser: 1.05 mg/dL (ref 0.76–1.27)
GFR calc Af Amer: 87 mL/min/{1.73_m2} (ref >59)
GFR calc non Af Amer: 75 mL/min/{1.73_m2} (ref >59)
Globulin, Total: 2.6 g/dL (ref 1.5–4.5)
Glucose: 122 mg/dL — ABNORMAL HIGH (ref 65–99)
Potassium: 4.5 mmol/L (ref 3.5–5.2)
Sodium: 139 mmol/L (ref 134–144)
Total Protein: 7.5 g/dL (ref 6.0–8.5)

## 2020-01-02 LAB — CBC WITH DIFFERENTIAL/PLATELET
Basophils Absolute: 0.1 10*3/uL (ref 0.0–0.2)
Basos: 1 %
EOS (ABSOLUTE): 0.3 10*3/uL (ref 0.0–0.4)
Eos: 5 %
Hematocrit: 46.7 % (ref 37.5–51.0)
Hemoglobin: 16.6 g/dL (ref 13.0–17.7)
Immature Grans (Abs): 0 10*3/uL (ref 0.0–0.1)
Immature Granulocytes: 0 %
Lymphocytes Absolute: 1.7 10*3/uL (ref 0.7–3.1)
Lymphs: 29 %
MCH: 32.8 pg (ref 26.6–33.0)
MCHC: 35.5 g/dL (ref 31.5–35.7)
MCV: 92 fL (ref 79–97)
Monocytes Absolute: 0.4 10*3/uL (ref 0.1–0.9)
Monocytes: 7 %
Neutrophils Absolute: 3.4 10*3/uL (ref 1.4–7.0)
Neutrophils: 58 %
Platelets: 179 10*3/uL (ref 150–450)
RBC: 5.06 x10E6/uL (ref 4.14–5.80)
RDW: 12.5 % (ref 11.6–15.4)
WBC: 5.8 10*3/uL (ref 3.4–10.8)

## 2020-01-02 LAB — LIPID PANEL
Chol/HDL Ratio: 5.4 ratio — ABNORMAL HIGH (ref 0.0–5.0)
Cholesterol, Total: 196 mg/dL (ref 100–199)
HDL: 36 mg/dL — ABNORMAL LOW (ref >39)
LDL Chol Calc (NIH): 113 mg/dL — ABNORMAL HIGH (ref 0–99)
Triglycerides: 273 mg/dL — ABNORMAL HIGH (ref 0–149)
VLDL Cholesterol Cal: 47 mg/dL — ABNORMAL HIGH (ref 5–40)

## 2020-01-02 MED ORDER — PAROXETINE HCL 20 MG PO TABS: 20.0000 mg | ORAL_TABLET | Freq: Every day | ORAL | 3 refills | Status: DC

## 2020-01-02 MED ORDER — LISINOPRIL-HYDROCHLOROTHIAZIDE 20-12.5 MG PO TABS: 1.0000 | ORAL_TABLET | Freq: Every day | ORAL | 3 refills | Status: DC

## 2020-01-02 NOTE — Progress Notes (Signed)
BP 137/90   Pulse 77   Ht $R'6\' 2"'Ny$  (1.88 m)   Wt (!) 311 lb (141.1 kg)   SpO2 94%   BMI 39.93 kg/m    Subjective:   Patient ID: Jeremy Bennett, male    DOB: [birth year], 64 y.o.   MRN: [id number]  HPI: Jeremy Bennett is a 64 y.o. male presenting on 01/02/2020 for Medical Management of Chronic Issues, Hypertension, and Anxiety   HPI Hypertension Patient is currently on lisinopril hydrochlorothiazide, and their blood pressure today is 137/90. Patient denies any lightheadedness or dizziness. Patient denies headaches, blurred vision, chest pains, shortness of breath, or weakness. Denies any side effects from medication and is content with current medication.   Hypertriglyceridemia Patient is coming in for recheck of his hyperlipidemia. The patient is currently taking no medication currently and will recheck today.. They deny any issues with myalgias or history of liver damage from it. They deny any focal numbness or weakness or chest pain.   Anxiety recheck Patient is coming in today for anxiety recheck.  He is currently taking Paxil and says that he been doing well on it.  He says he missed 3 days a couple months ago and he felt the difference and he was more irritable and feels like he very much needs the medication still.  Relevant past medical, surgical, family and social history reviewed and updated as indicated. Interim medical history since our last visit reviewed. Allergies and medications reviewed and updated.  Review of Systems  Constitutional: Negative for chills and fever.  Eyes: Negative for discharge.  Respiratory: Negative for shortness of breath and wheezing.   Cardiovascular: Negative for chest pain and leg swelling.  Musculoskeletal: Negative for back pain and gait problem.  Skin: Negative for rash.  Neurological: Negative for dizziness, weakness and light-headedness.  All other systems reviewed and are negative.   Per HPI unless specifically indicated  above   Allergies as of 01/02/2020      Reactions   No Known Allergies       Medication List       Accurate as of January 02, 2020 11:11 AM. If you have any questions, ask your nurse or doctor.        STOP taking these medications   predniSONE 20 MG tablet Commonly known as: DELTASONE Stopped by: Fransisca Kaufmann Ajooni Karam, MD     TAKE these medications   colchicine 0.6 MG tablet For acute attack of gout use one at onset, then one every two hours until 6 pills have been taken. Stop sooner if pain is relieved or if nausea occurs. Then revert to BID   lisinopril-hydrochlorothiazide 20-12.5 MG tablet Commonly known as: ZESTORETIC Take 1 tablet by mouth daily.   PARoxetine 20 MG tablet Commonly known as: PAXIL Take 1 tablet (20 mg total) by mouth daily.   Systane 0.4-0.3 % Soln Generic drug: Polyethyl Glycol-Propyl Glycol Place 1 drop into both eyes daily as needed (Dry eyes).        Objective:   BP 137/90   Pulse 77   Ht $R'6\' 2"'ha$  (1.88 m)   Wt (!) 311 lb (141.1 kg)   SpO2 94%   BMI 39.93 kg/m   Wt Readings from Last 3 Encounters:  01/02/20 (!) 311 lb (141.1 kg)  12/02/18 (!) 308 lb (139.7 kg)  02/23/18 (!) 303 lb 9.6 oz (137.7 kg)    Physical Exam Vitals and nursing note reviewed.  Constitutional:      General: He  is not in acute distress.    Appearance: He is well-developed. He is not diaphoretic.  Eyes:     General: No scleral icterus.    Conjunctiva/sclera: Conjunctivae normal.  Neck:     Thyroid: No thyromegaly.  Cardiovascular:     Rate and Rhythm: Normal rate and regular rhythm.     Heart sounds: Normal heart sounds. No murmur heard.   Pulmonary:     Effort: Pulmonary effort is normal. No respiratory distress.     Breath sounds: Normal breath sounds. No wheezing.  Musculoskeletal:        General: Normal range of motion.     Cervical back: Neck supple.  Lymphadenopathy:     Cervical: No cervical adenopathy.  Skin:    General: Skin is warm and dry.      Findings: No rash.  Neurological:     Mental Status: He is alert and oriented to person, place, and time.     Coordination: Coordination normal.  Psychiatric:        Behavior: Behavior normal.     Assessment & Plan:   Problem List Items Addressed This Visit      Cardiovascular and Mediastinum   Hypertension   Relevant Medications   lisinopril-hydrochlorothiazide (ZESTORETIC) 20-12.5 MG tablet     Other   Anxiety   Relevant Medications   PARoxetine (PAXIL) 20 MG tablet   Other Relevant Orders   CBC with Differential/Platelet    Other Visit Diagnoses    Essential hypertension    -  Primary   Relevant Medications   lisinopril-hydrochlorothiazide (ZESTORETIC) 20-12.5 MG tablet   Other Relevant Orders   CMP14+EGFR   Need for immunization against influenza       Relevant Orders   Flu Vaccine QUAD 36+ mos IM (Completed)   Hypertriglyceridemia       Relevant Medications   lisinopril-hydrochlorothiazide (ZESTORETIC) 20-12.5 MG tablet   Other Relevant Orders   Lipid panel   OSA (obstructive sleep apnea)       Relevant Orders   Split night study      Continue current medication, patient says he was diagnosed with sleep apnea 10 years ago but needs new orders for it.  Will need new sleep study.  Will check blood work but patient seems to be doing very well on his Paxil and his lisinopril hydrochlorothiazide. Follow up plan: Return in about 6 months (around 07/02/2020), or if symptoms worsen or fail to improve, for htn and anxiety and triglyceride.  Counseling provided for all of the vaccine components Orders Placed This Encounter  Procedures  . Flu Vaccine QUAD 36+ mos IM  . CBC with Differential/Platelet  . CMP14+EGFR  . Lipid panel  . Split night study    Caryl Pina, MD [facility] 01/02/2020, 11:11 AM

## 2020-04-06 DIAGNOSIS — I10 Essential (primary) hypertension: Secondary | ICD-10-CM | POA: Diagnosis not present

## 2020-04-06 DIAGNOSIS — S93401A Sprain of unspecified ligament of right ankle, initial encounter: Secondary | ICD-10-CM | POA: Diagnosis not present

## 2020-04-06 DIAGNOSIS — S82301A Unspecified fracture of lower end of right tibia, initial encounter for closed fracture: Secondary | ICD-10-CM | POA: Diagnosis not present

## 2020-04-06 DIAGNOSIS — W1849XA Other slipping, tripping and stumbling without falling, initial encounter: Secondary | ICD-10-CM | POA: Diagnosis not present

## 2020-04-10 DIAGNOSIS — S8252XA Displaced fracture of medial malleolus of left tibia, initial encounter for closed fracture: Secondary | ICD-10-CM | POA: Diagnosis not present

## 2020-04-10 DIAGNOSIS — S8251XA Displaced fracture of medial malleolus of right tibia, initial encounter for closed fracture: Secondary | ICD-10-CM | POA: Diagnosis not present

## 2020-04-19 DIAGNOSIS — S8251XA Displaced fracture of medial malleolus of right tibia, initial encounter for closed fracture: Secondary | ICD-10-CM | POA: Diagnosis not present

## 2020-04-19 DIAGNOSIS — G4730 Sleep apnea, unspecified: Secondary | ICD-10-CM | POA: Diagnosis not present

## 2020-04-19 DIAGNOSIS — Z79899 Other long term (current) drug therapy: Secondary | ICD-10-CM | POA: Diagnosis not present

## 2020-04-19 DIAGNOSIS — M25571 Pain in right ankle and joints of right foot: Secondary | ICD-10-CM | POA: Diagnosis not present

## 2020-04-19 DIAGNOSIS — G4733 Obstructive sleep apnea (adult) (pediatric): Secondary | ICD-10-CM | POA: Diagnosis not present

## 2020-04-19 DIAGNOSIS — G8918 Other acute postprocedural pain: Secondary | ICD-10-CM | POA: Diagnosis not present

## 2020-04-19 DIAGNOSIS — I10 Essential (primary) hypertension: Secondary | ICD-10-CM | POA: Diagnosis not present

## 2020-04-19 DIAGNOSIS — R9431 Abnormal electrocardiogram [ECG] [EKG]: Secondary | ICD-10-CM | POA: Diagnosis not present

## 2020-04-19 DIAGNOSIS — S82891B Other fracture of right lower leg, initial encounter for open fracture type I or II: Secondary | ICD-10-CM | POA: Diagnosis not present

## 2020-05-06 DIAGNOSIS — S8251XD Displaced fracture of medial malleolus of right tibia, subsequent encounter for closed fracture with routine healing: Secondary | ICD-10-CM | POA: Diagnosis not present

## 2020-06-10 DIAGNOSIS — S8251XD Displaced fracture of medial malleolus of right tibia, subsequent encounter for closed fracture with routine healing: Secondary | ICD-10-CM | POA: Diagnosis not present

## 2020-07-03 ENCOUNTER — Ambulatory Visit: Payer: Medicare Other | Admitting: Family Medicine

## 2020-07-10 DIAGNOSIS — S8251XD Displaced fracture of medial malleolus of right tibia, subsequent encounter for closed fracture with routine healing: Secondary | ICD-10-CM | POA: Diagnosis not present

## 2020-08-09 ENCOUNTER — Encounter: Payer: Self-pay | Admitting: Family Medicine

## 2020-08-09 ENCOUNTER — Ambulatory Visit (INDEPENDENT_AMBULATORY_CARE_PROVIDER_SITE_OTHER): Payer: Medicare Other | Admitting: Family Medicine

## 2020-08-09 ENCOUNTER — Other Ambulatory Visit: Payer: Self-pay

## 2020-08-09 VITALS — BP 125/86 | HR 73 | Ht 74.0 in | Wt 304.0 lb

## 2020-08-09 DIAGNOSIS — I10 Essential (primary) hypertension: Secondary | ICD-10-CM | POA: Diagnosis not present

## 2020-08-09 DIAGNOSIS — F419 Anxiety disorder, unspecified: Secondary | ICD-10-CM | POA: Diagnosis not present

## 2020-08-09 LAB — CBC WITH DIFFERENTIAL/PLATELET
Basophils Absolute: 0 10*3/uL (ref 0.0–0.2)
Basos: 1 %
EOS (ABSOLUTE): 0.2 10*3/uL (ref 0.0–0.4)
Eos: 4 %
Hematocrit: 46 % (ref 37.5–51.0)
Hemoglobin: 15.7 g/dL (ref 13.0–17.7)
Immature Grans (Abs): 0 10*3/uL (ref 0.0–0.1)
Immature Granulocytes: 0 %
Lymphocytes Absolute: 1.4 10*3/uL (ref 0.7–3.1)
Lymphs: 29 %
MCH: 31.7 pg (ref 26.6–33.0)
MCHC: 34.1 g/dL (ref 31.5–35.7)
MCV: 93 fL (ref 79–97)
Monocytes Absolute: 0.3 10*3/uL (ref 0.1–0.9)
Monocytes: 6 %
Neutrophils Absolute: 3 10*3/uL (ref 1.4–7.0)
Neutrophils: 60 %
Platelets: 152 10*3/uL (ref 150–450)
RBC: 4.96 x10E6/uL (ref 4.14–5.80)
RDW: 13.1 % (ref 11.6–15.4)
WBC: 5 10*3/uL (ref 3.4–10.8)

## 2020-08-09 LAB — CMP14+EGFR
ALT: 62 IU/L — ABNORMAL HIGH (ref 0–44)
AST: 53 IU/L — ABNORMAL HIGH (ref 0–40)
Albumin/Globulin Ratio: 1.7 (ref 1.2–2.2)
Albumin: 4.4 g/dL (ref 3.8–4.8)
Alkaline Phosphatase: 102 IU/L (ref 44–121)
BUN/Creatinine Ratio: 14 (ref 10–24)
BUN: 11 mg/dL (ref 8–27)
Bilirubin Total: 1.3 mg/dL — ABNORMAL HIGH (ref 0.0–1.2)
CO2: 22 mmol/L (ref 20–29)
Calcium: 9.2 mg/dL (ref 8.6–10.2)
Chloride: 99 mmol/L (ref 96–106)
Creatinine, Ser: 0.76 mg/dL (ref 0.76–1.27)
Globulin, Total: 2.6 g/dL (ref 1.5–4.5)
Glucose: 143 mg/dL — ABNORMAL HIGH (ref 65–99)
Potassium: 3.6 mmol/L (ref 3.5–5.2)
Sodium: 142 mmol/L (ref 134–144)
Total Protein: 7 g/dL (ref 6.0–8.5)
eGFR: 100 mL/min/{1.73_m2} (ref >59)

## 2020-08-09 LAB — LIPID PANEL
Chol/HDL Ratio: 6.1 ratio — ABNORMAL HIGH (ref 0.0–5.0)
Cholesterol, Total: 202 mg/dL — ABNORMAL HIGH (ref 100–199)
HDL: 33 mg/dL — ABNORMAL LOW (ref >39)
LDL Chol Calc (NIH): 113 mg/dL — ABNORMAL HIGH (ref 0–99)
Triglycerides: 320 mg/dL — ABNORMAL HIGH (ref 0–149)
VLDL Cholesterol Cal: 56 mg/dL — ABNORMAL HIGH (ref 5–40)

## 2020-08-09 MED ORDER — TRAZODONE HCL 50 MG PO TABS: 25.0000 mg | ORAL_TABLET | Freq: Every evening | ORAL | 3 refills | Status: DC | PRN

## 2020-08-09 MED ORDER — PAROXETINE HCL 20 MG PO TABS: 20.0000 mg | ORAL_TABLET | Freq: Every day | ORAL | 3 refills | Status: DC

## 2020-08-09 MED ORDER — LISINOPRIL-HYDROCHLOROTHIAZIDE 20-12.5 MG PO TABS: 1.0000 | ORAL_TABLET | Freq: Every day | ORAL | 3 refills | Status: DC

## 2020-08-09 NOTE — Progress Notes (Signed)
BP 125/86   Pulse 73   Ht _0  (1.88 m)   Wt (!) 304 lb (137.9 kg)   SpO2 96%   BMI 39.03 kg/m    Subjective:   Patient ID: Jeremy Bennett, male    DOB: 1955/06/13, 65 y.o.   MRN: [id number]  HPI: Jeremy Bennett is a 65 y.o. male presenting on 08/09/2020 for Medical Management of Chronic Issues, Hypertension, and Anxiety   HPI Hypertension Patient is currently on lisinopril hydrochlorothiazide, and their blood pressure today is 125/86. Patient denies any lightheadedness or dizziness. Patient denies headaches, blurred vision, chest pains, shortness of breath, or weakness. Denies any side effects from medication and is content with current medication.   Anxiety recheck Patient is currently taking Paxil for his anxiety.  He says he is doing pretty well except he is having some insomnia, he just has trouble calming his mind down at night.  He says the Paxil is helping a lot through the day but just the nighttime that bothers him.  He has tried some stronger things in the past like Ambien but it was too strong for him and he did not like it would like to try something more mild.  He is using melatonin right now it does help some but not consistently.  He denies any suicidal ideations or thoughts of hurting self. Depression screen [facility] 2/9 08/09/2020 01/02/2020 12/02/2018 02/23/2018  Decreased Interest 0 0 0 0  Down, Depressed, Hopeless 0 0 0 0  PHQ - 2 Score 0 0 0 0     Relevant past medical, surgical, family and social history reviewed and updated as indicated. Interim medical history since our last visit reviewed. Allergies and medications reviewed and updated.  Review of Systems  Constitutional:  Negative for chills and fever.  Eyes:  Negative for visual disturbance.  Respiratory:  Negative for shortness of breath and wheezing.   Cardiovascular:  Negative for chest pain and leg swelling.  Musculoskeletal:  Negative for back pain and gait problem.  Skin:  Negative for rash.   Neurological:  Negative for dizziness, weakness and light-headedness.  All other systems reviewed and are negative.  Per HPI unless specifically indicated above   Allergies as of 08/09/2020       Reactions   No Known Allergies         Medication List        Accurate as of August 09, 2020 10:01 AM. If you have any questions, ask your nurse or doctor.          colchicine 0.6 MG tablet For acute attack of gout use one at onset, then one every two hours until 6 pills have been taken. Stop sooner if pain is relieved or if nausea occurs. Then revert to BID   lisinopril-hydrochlorothiazide 20-12.5 MG tablet Commonly known as: ZESTORETIC Take 1 tablet by mouth daily.   PARoxetine 20 MG tablet Commonly known as: PAXIL Take 1 tablet (20 mg total) by mouth daily.   Systane 0.4-0.3 % Soln Generic drug: Polyethyl Glycol-Propyl Glycol Place 1 drop into both eyes daily as needed (Dry eyes).   traZODone 50 MG tablet Commonly known as: DESYREL Take 0.5-1 tablets (25-50 mg total) by mouth at bedtime as needed for sleep. Started by: Fransisca Kaufmann Nusaiba Guallpa, MD         Objective:   BP 125/86   Pulse 73   Ht _1  (1.88 m)   Wt (!) 304 lb (137.9 kg)  SpO2 96%   BMI 39.03 kg/m   Wt Readings from Last 3 Encounters:  08/09/20 (!) 304 lb (137.9 kg)  01/02/20 (!) 311 lb (141.1 kg)  12/02/18 (!) 308 lb (139.7 kg)    Physical Exam Vitals and nursing note reviewed.  Constitutional:      General: He is not in acute distress.    Appearance: He is well-developed. He is not diaphoretic.  Eyes:     General: No scleral icterus.    Conjunctiva/sclera: Conjunctivae normal.  Neck:     Thyroid: No thyromegaly.  Cardiovascular:     Rate and Rhythm: Normal rate and regular rhythm.     Heart sounds: Normal heart sounds. No murmur heard. Pulmonary:     Effort: Pulmonary effort is normal. No respiratory distress.     Breath sounds: Normal breath sounds. No wheezing.  Musculoskeletal:         General: Normal range of motion.     Cervical back: Neck supple.  Lymphadenopathy:     Cervical: No cervical adenopathy.  Skin:    General: Skin is warm and dry.     Findings: No rash.  Neurological:     Mental Status: He is alert and oriented to person, place, and time.     Coordination: Coordination normal.  Psychiatric:        Behavior: Behavior normal.      Assessment & Plan:   Problem List Items Addressed This Visit       Cardiovascular and Mediastinum   Hypertension - Primary   Relevant Medications   lisinopril-hydrochlorothiazide (ZESTORETIC) 20-12.5 MG tablet   Other Relevant Orders   CMP14+EGFR     Other   Anxiety   Relevant Medications   PARoxetine (PAXIL) 20 MG tablet   traZODone (DESYREL) 50 MG tablet   Other Relevant Orders   CBC with Differential/Platelet   CMP14+EGFR   Other Visit Diagnoses     Essential hypertension       Relevant Medications   lisinopril-hydrochlorothiazide (ZESTORETIC) 20-12.5 MG tablet   Other Relevant Orders   CBC with Differential/Platelet   CMP14+EGFR   Lipid panel   CBC with Differential/Platelet   CMP14+EGFR   Lipid panel       Continue current medication, no changes except for we will add trazodone.  We will follow-up in 6 months for physical Follow up plan: Return in about 6 months (around 02/09/2021), or if symptoms worsen or fail to improve, for Anxiety and physical and hypertension.  Counseling provided for all of the vaccine components Orders Placed This Encounter  Procedures   CBC with Differential/Platelet   CMP14+EGFR   Lipid panel   CBC with Differential/Platelet   CMP14+EGFR   Lipid panel    Caryl Pina, MD [facility] 08/09/2020, 10:01 AM

## 2020-08-13 ENCOUNTER — Ambulatory Visit (INDEPENDENT_AMBULATORY_CARE_PROVIDER_SITE_OTHER): Payer: Medicare Other

## 2020-08-13 DIAGNOSIS — Z0000 Encounter for general adult medical examination without abnormal findings: Secondary | ICD-10-CM | POA: Diagnosis not present

## 2020-08-13 NOTE — Progress Notes (Signed)
MEDICARE ANNUAL WELLNESS VISIT  08/13/2020  Telephone Visit Disclaimer This Medicare AWV was conducted by telephone due to national recommendations for restrictions regarding the COVID-19 Pandemic (e.g. social distancing).  I verified, using two identifiers, that I am speaking with Jeremy Bennett or their authorized healthcare agent. I discussed the limitations, risks, security, and privacy concerns of performing an evaluation and management service by telephone and the potential availability of an in-person appointment in the future. The patient expressed understanding and agreed to proceed.  Location of Patient: Home Location of Provider (nurse):  [facility]  Subjective:    Jeremy Bennett is a 65 y.o. male patient of Dettinger, Fransisca Kaufmann, MD who had a Medicare Annual Wellness Visit today via telephone. Jeremy Bennett is Retired and lives with their spouse. He has two children. He reports that he is socially active and does interact with friends/family regularly. He is minimally physically active and enjoys spending time with his family and being at the beach.  Patient Care Team: Dettinger, Fransisca Kaufmann, MD as PCP - General (Family Medicine)  Advanced Directives 08/13/2020 12/15/2018 10/06/2016 09/29/2016 05/19/2016 05/19/2016 05/11/2016  Does Patient Have a Medical Advance Directive? Yes No Yes Yes Yes Yes Yes  Type of Advance Directive Living will;Healthcare Power of Attorney - Living will;Healthcare Power of [place];Living will Living will;Healthcare Power of [place];Living will -  Does patient want to make changes to medical advance directive? No - Patient declined - No - Patient declined No - Patient declined No - Patient declined - -  Copy of [place] in Chart? No - copy requested - Yes No - copy requested No - copy requested No - copy requested -  Would patient like information on creating a medical advance directive? No -  Patient declined No - Patient declined - - - - [facility] Utilization Over the Past 12 Months: # of hospitalizations or ER visits: 1 # of surgeries: 1  Review of Systems    Patient reports that his overall health is unchanged compared to last year.  History obtained from chart review and the patient  Patient Reported Readings (BP, Pulse, CBG, Weight, etc) none  Pain Assessment Pain : No/denies pain     Current Medications & Allergies (verified) Allergies as of 08/13/2020       Reactions   No Known Allergies         Medication List        Accurate as of August 13, 2020 10:48 AM. If you have any questions, ask your nurse or doctor.          colchicine 0.6 MG tablet For acute attack of gout use one at onset, then one every two hours until 6 pills have been taken. Stop sooner if pain is relieved or if nausea occurs. Then revert to BID   lisinopril-hydrochlorothiazide 20-12.5 MG tablet Commonly known as: ZESTORETIC Take 1 tablet by mouth daily.   multivitamin tablet Take 1 tablet by mouth daily.   PARoxetine 20 MG tablet Commonly known as: PAXIL Take 1 tablet (20 mg total) by mouth daily.   Systane 0.4-0.3 % Soln Generic drug: Polyethyl Glycol-Propyl Glycol Place 1 drop into both eyes daily as needed (Dry eyes).   traZODone 50 MG tablet Commonly known as: DESYREL Take 0.5-1 tablets (25-50 mg total) by mouth at bedtime as needed for sleep.        History (reviewed): Past Medical  History:  Diagnosis Date   ADD (attention deficit disorder)    Arthritis    Gout    History of asbestosis    Hypertension    Sleep apnea    tested more than 5 yrs ago   Past Surgical History:  Procedure Laterality Date   COLONOSCOPY     COLONOSCOPY N/A 12/15/2018   Procedure: COLONOSCOPY;  Surgeon: Rogene Houston, MD;  [facility];  Service: Endoscopy;  Laterality: N/A;   FRACTURE SURGERY     left clavicle   1972   KNEE ARTHROSCOPY     right    POLYPECTOMY  12/15/2018   Procedure: POLYPECTOMY;  Surgeon: Rogene Houston, MD;  [facility]: [facility];  Service: Endoscopy;;  transverse   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   Procedure: TOTAL KNEE ARTHROPLASTY;  Surgeon: Melrose Nakayama, MD;  Location: [place];  Service: Orthopedics;  Laterality: Right;   TOTAL KNEE ARTHROPLASTY Left 10/06/2016   Procedure: TOTAL KNEE ARTHROPLASTY;  Surgeon: Melrose Nakayama, MD;  Location: [place];  Service: Orthopedics;  Laterality: Left;   Family History  Problem Relation Age of Onset   Heart disease Father 16       heart attack   HIV/AIDS Father    Cancer Sister        uterine   Social History   Socioeconomic History   Marital status: Married    Spouse name: Not on file   Number of children: Not on file   Years of education: Not on file   Highest education level: Not on file  Occupational History   Not on file  Tobacco Use   Smoking status: Never   Smokeless tobacco: Never  Vaping Use   Vaping Use: Never used  Substance and Sexual Activity   Alcohol use: Yes    Alcohol/week: 1.0 standard drink    Types: 1 Cans of beer per week    Comment: in a week's time   Drug use: No   Sexual activity: Not on file  Other Topics Concern   Not on file  Social History Narrative   Not on file   Social Determinants of Health   Financial Resource Strain: Not on file  Food Insecurity: Not on file  Transportation Needs: Not on file  Physical Activity: Not on file  Stress: Not on file  Social Connections: Not on file    Activities of Daily Living In your present state of health, do you have any difficulty performing the following activities: 08/13/2020  Hearing? N  Vision? N  Difficulty concentrating or making decisions? N  Walking or climbing stairs? Y  Comment knee pain  Dressing or bathing? N  Doing errands, shopping? N  Preparing Food and eating ? N  Using the Toilet? N  In the past six  months, have you accidently leaked urine? N  Do you have problems with loss of bowel control? N  Managing your Medications? N  Managing your Finances? N  Housekeeping or managing your Housekeeping? N  Some recent data might be hidden    Patient Education/ Literacy How often do you need to have someone help you when you read instructions, pamphlets, or other written materials from your doctor or pharmacy?: 1 - Never What is the last grade level you completed in school?: Some college  Exercise Current Exercise Habits: The patient does not participate in regular exercise at present  Diet Patient reports consuming 2 meals  a day and 2 snack(s) a day Patient reports that his primary diet is: Regular Patient reports that she does have regular access to food.   Depression Screen PHQ 2/9 Scores 08/13/2020 08/09/2020 01/02/2020 12/02/2018 02/23/2018  PHQ - 2 Score 0 0 0 0 0     Fall Risk Fall Risk  08/13/2020 08/09/2020 01/02/2020 12/02/2018  Falls in the past year? 0 0 0 0  Follow up Falls evaluation completed - - -     Objective:  Jeremy Bennett seemed alert and oriented and he participated appropriately during our telephone visit.  Blood Pressure Weight BMI  BP Readings from Last 3 Encounters:  08/09/20 125/86  01/02/20 137/90  12/15/18 97/62   Wt Readings from Last 3 Encounters:  08/09/20 (!) 304 lb (137.9 kg)  01/02/20 (!) 311 lb (141.1 kg)  12/02/18 (!) 308 lb (139.7 kg)   BMI Readings from Last 1 Encounters:  08/09/20 39.03 kg/m    *Unable to obtain current vital signs, weight, and BMI due to telephone visit type  Hearing/Vision  Herron did not seem to have difficulty with hearing/understanding during the telephone conversation Reports that he has had a formal eye exam by an eye care professional within the past year Reports that he has not had a formal hearing evaluation within the past year *Unable to fully assess hearing and vision during telephone visit type  Cognitive  Function: 6CIT Screen 08/13/2020  What Year? 0 points  What month? 0 points  What time? 0 points  Count back from 20 0 points  Months in reverse 0 points  Repeat phrase 2 points  Total Score 2   (Normal:0-7, Significant for Dysfunction: >8)  Normal Cognitive Function Screening: Yes   Immunization & Health Maintenance Record Immunization History  Administered Date(s) Administered   Influenza,inj,Quad PF,6+ Mos 10/08/2016, 02/23/2018, 12/02/2018, 01/02/2020   PFIZER(Purple Top)SARS-COV-2 Vaccination 10/08/2019, 11/08/2019    Health Maintenance  Topic Date Due   TETANUS/TDAP  02/10/2021 (Originally 08/06/1974)   COVID-19 Vaccine (3 - Pfizer risk series) 08/11/2021 (Originally 12/06/2019)   Zoster Vaccines- Shingrix (1 of 2) 08/11/2021 (Originally 08/06/1974)   INFLUENZA VACCINE  08/26/2020   COLONOSCOPY (Pts 45-66yrs Insurance coverage will need to be confirmed)  12/15/2023   Hepatitis C Screening  Completed   HPV VACCINES  Aged Out   Pneumococcal Vaccine 5-16 Years old  Discontinued   HIV Screening  Discontinued       Assessment  This is a routine wellness examination for [employer].  Health Maintenance: Due or Overdue There are no preventive care reminders to display for this patient.  Jeremy Bennett does not need a referral for Community Assistance: Care Management:   no Social Work:    no Prescription Assistance:  no Nutrition/Diabetes Education:  no   Plan:  Personalized Goals  Goals Addressed             This Visit's Progress    Patient Stated       08/13/2020 AWV Goal: Exercise for General Health  Patient will verbalize understanding of the benefits of increased physical activity: Exercising regularly is important. It will improve your overall fitness, flexibility, and endurance. Regular exercise also will improve your overall health. It can help you control your weight, reduce stress, and improve your bone density. Over the next year, patient will  increase physical activity as tolerated with a goal of at least 150 minutes of moderate physical activity per week.  You can tell that you are  exercising at a moderate intensity if your heart starts beating faster and you start breathing faster but can still hold a conversation. Moderate-intensity exercise ideas include: Walking 1 mile (1.6 km) in about 15 minutes Biking Hiking Golfing Dancing Water aerobics Patient will verbalize understanding of everyday activities that increase physical activity by providing examples like the following: Yard work, such as: Sales promotion account executive Gardening Washing windows or floors Patient will be able to explain general safety guidelines for exercising:  Before you start a new exercise program, talk with your health care provider. Do not exercise so much that you hurt yourself, feel dizzy, or get very short of breath. Wear comfortable clothes and wear shoes with good support. Drink plenty of water while you exercise to prevent dehydration or heat stroke. Work out until your breathing and your heartbeat get faster.             Personalized Health Maintenance & Screening Recommendations  Td vaccine  Lung Cancer Screening Recommended: no (Low Dose CT Chest recommended if Age 76-80 years, 30 pack-year currently smoking OR have quit w/in past 15 years) Hepatitis C Screening recommended: no HIV Screening recommended: no  Advanced Directives: Written information was not prepared per patient's request.  Referrals & Orders No orders of the defined types were placed in this encounter.   Follow-up Plan Follow-up with Dettinger, Fransisca Kaufmann, MD as planne    I have personally reviewed and noted the following in the patient's chart:   Medical and social history Use of alcohol, tobacco or illicit drugs  Current medications and supplements Functional ability and  status Nutritional status Physical activity Advanced directives List of other physicians Hospitalizations, surgeries, and ER visits in previous 12 months Vitals Screenings to include cognitive, depression, and falls Referrals and appointments  In addition, I have reviewed and discussed with Jeremy Bennett certain preventive protocols, quality metrics, and best practice recommendations. A written personalized care plan for preventive services as well as general preventive health recommendations is available and can be mailed to the patient at his request.      Felicity Coyer, LPN    [id number]

## 2020-08-21 DIAGNOSIS — S8251XD Displaced fracture of medial malleolus of right tibia, subsequent encounter for closed fracture with routine healing: Secondary | ICD-10-CM | POA: Diagnosis not present

## 2020-10-22 ENCOUNTER — Encounter: Payer: Self-pay | Admitting: Family Medicine

## 2020-10-22 ENCOUNTER — Ambulatory Visit (INDEPENDENT_AMBULATORY_CARE_PROVIDER_SITE_OTHER): Payer: Medicare Other | Admitting: Family Medicine

## 2020-10-22 DIAGNOSIS — R42 Dizziness and giddiness: Secondary | ICD-10-CM

## 2020-10-22 MED ORDER — MECLIZINE HCL 12.5 MG PO TABS: 12.5000 mg | ORAL_TABLET | Freq: Three times a day (TID) | ORAL | 0 refills | Status: DC | PRN

## 2020-10-22 NOTE — Progress Notes (Signed)
   Virtual Visit  Note Due to COVID-19 pandemic this visit was conducted virtually. This visit type was conducted due to national recommendations for restrictions regarding the COVID-19 Pandemic (e.g. social distancing, sheltering in place) in an effort to limit this patient's exposure and mitigate transmission in our community. All issues noted in this document were discussed and addressed.  A physical exam was not performed with this format.  I connected with [name] on 10/22/20 at 1005 by telephone and verified that I am speaking with the correct person using two identifiers. Jeremy Bennett is currently located at home and his family is currently with him during the visit. The provider, Gwenlyn Perking, FNP is located in their office at time of visit.  I discussed the limitations, risks, security and privacy concerns of performing an evaluation and management service by telephone and the availability of in person appointments. I also discussed with the patient that there may be a patient responsible charge related to this service. The patient expressed understanding and agreed to proceed.  CC: vertigo  History and Present Illness:  HPI Jeremy Bennett reports vertigo that started yesterday. He was laying down doing work under a house, he turned his head, and felt like the room was spinning when he did this. This resolved with changing his position. He has had episodes of vertigo since. They continue to feel like the room spinning and occur with head movement or changes in position. He also feels off balance. He denies nausea, vomiting, fever, chest pain, shortness of breath, confusion, focal weakness, or head injury.    ROS As per HPI.   Observations/Objective: Alert and oriented x 3. Able to speak in full sentences without difficulty.    Assessment and Plan: [name] was seen today for dizziness.  Diagnoses and all orders for this visit:  Vertigo Meclizine as below. Discussed hydration,  slow movements. Discussed return precautions.  -     meclizine (ANTIVERT) 12.5 MG tablet; Take 1-2 tablets (12.5-25 mg total) by mouth 3 (three) times daily as needed for dizziness.    Follow Up Instructions: 1016    I discussed the assessment and treatment plan with the patient. The patient was provided an opportunity to ask questions and all were answered. The patient agreed with the plan and demonstrated an understanding of the instructions.   The patient was advised to call back or seek an in-person evaluation if the symptoms worsen or if the condition fails to improve as anticipated.  The above assessment and management plan was discussed with the patient. The patient verbalized understanding of and has agreed to the management plan. Patient is aware to call the clinic if symptoms persist or worsen. Patient is aware when to return to the clinic for a follow-up visit. Patient educated on when it is appropriate to go to the emergency department.   Time call ended:  1016  I provided 11 minutes of  non face-to-face time during this encounter.    Gwenlyn Perking, FNP

## 2020-10-22 NOTE — Patient Instructions (Signed)
Vertigo Vertigo is the feeling that you or your surroundings are moving when they are not. This feeling can come and go at any time. Vertigo often goes away on its own. Vertigo can be dangerous if it occurs while you are doing something thatcould endanger yourself or others, such as driving or operating machinery. Your health care provider will do tests to try to determine the cause of your vertigo. Tests will also help your health care provider decide how best totreat your condition. Follow these instructions at home: Eating and drinking     Dehydration can make vertigo worse. Drink enough fluid to keep your urine pale yellow. Do not drink alcohol. Activity Return to your normal activities as told by your health care provider. Ask your health care provider what activities are safe for you. In the morning, first sit up on the side of the bed. When you feel okay, stand slowly while you hold onto something until you know that your balance is fine. Move slowly. Avoid sudden body or head movements or certain positions, as told by your health care provider. If you have trouble walking or keeping your balance, try using a cane for stability. If you feel dizzy or unstable, sit down right away. Avoid doing any tasks that would cause danger to you or others if vertigo occurs. Avoid bending down if you feel dizzy. Place items in your home so that they are easy for you to reach without bending or leaning over. Do not drive or use machinery if you feel dizzy. General instructions Take over-the-counter and prescription medicines only as told by your health care provider. Keep all follow-up visits. This is important. Contact a health care provider if: Your medicines do not relieve your vertigo or they make it worse. Your condition gets worse or you develop new symptoms. You have a fever. You develop nausea or vomiting, or if nausea gets worse. Your family or friends notice any behavioral changes. You  have numbness or a prickling and tingling sensation in part of your body. Get help right away if you: Are always dizzy or you faint. Develop severe headaches. Develop a stiff neck. Develop sensitivity to light. Have difficulty moving or speaking. Have weakness in your hands, arms, or legs. Have changes in your hearing or vision. These symptoms may represent a serious problem that is an emergency. Do not wait to see if the symptoms will go away. Get medical help right away. Call your local emergency services (911 in the [place] Do not drive yourself to the hospital. Summary Vertigo is the feeling that you or your surroundings are moving when they are not. Your health care provider will do tests to try to determine the cause of your vertigo. Follow instructions for home care. You may be told to avoid certain tasks, positions, or movements. Contact a health care provider if your medicines do not relieve your symptoms, or if you have a fever, nausea, vomiting, or changes in behavior. Get help right away if you have severe headaches or difficulty speaking, or you develop hearing or vision problems. This information is not intended to replace advice given to you by your health care provider. Make sure you discuss any questions you have with your healthcare provider. Document Revised: 12/13/2019 Document Reviewed: 12/13/2019 Elsevier Patient Education  2022 [employer]  

## 2020-11-06 ENCOUNTER — Ambulatory Visit (INDEPENDENT_AMBULATORY_CARE_PROVIDER_SITE_OTHER): Payer: Medicare Other | Admitting: Family Medicine

## 2020-11-06 ENCOUNTER — Encounter: Payer: Self-pay | Admitting: Family Medicine

## 2020-11-06 DIAGNOSIS — H938X1 Other specified disorders of right ear: Secondary | ICD-10-CM | POA: Diagnosis not present

## 2020-11-06 DIAGNOSIS — J029 Acute pharyngitis, unspecified: Secondary | ICD-10-CM | POA: Diagnosis not present

## 2020-11-06 LAB — RAPID STREP SCREEN (MED CTR MEBANE ONLY): Strep Gp A Ag, IA W/Reflex: NEGATIVE

## 2020-11-06 LAB — VERITOR FLU A/B WAIVED
Influenza A: NEGATIVE
Influenza B: NEGATIVE

## 2020-11-06 LAB — CULTURE, GROUP A STREP

## 2020-11-06 NOTE — Progress Notes (Signed)
   Virtual Visit  Note Due to COVID-19 pandemic this visit was conducted virtually. This visit type was conducted due to national recommendations for restrictions regarding the COVID-19 Pandemic (e.g. social distancing, sheltering in place) in an effort to limit this patient's exposure and mitigate transmission in our community. All issues noted in this document were discussed and addressed.  A physical exam was not performed with this format.  I connected with Jeremy Bennett on 11/06/20 at 1355 by telephone and verified that I am speaking with the correct person using two identifiers. Jeremy Bennett is currently located in his car and no one is currently with him during the visit. The provider, Gwenlyn Perking, FNP is located in their office at time of visit.  I discussed the limitations, risks, security and privacy concerns of performing an evaluation and management service by telephone and the availability of in person appointments. I also discussed with the patient that there may be a patient responsible charge related to this service. The patient expressed understanding and agreed to proceed.  CC: sore throat  History and Present Illness:  HPI Jeremy Bennett reports a sore throat x 3-4 days. The pain is constant. He has used hot tea with short term improvement. He denies fever, chills, shortness of breath, or chest pain. He has not looked at the back of his throat. He has been dealing with ear congestion in his right ear for the last few weeks. This has been unchanged. He has been around others who have had Covid recently.     ROS As per HPI.   Observations/Objective: Alert and oriented x 3. Able to speak in full sentences without difficulty.   Assessment and Plan: Jeremy Bennett was seen today for sore throat.  Diagnoses and all orders for this visit:  Sore throat Negative strep and flu. Covid pending, quarantine until results. Try zyrtec and/or flonase for sore throat and ear congestion.  -      Novel Coronavirus, NAA ([facility]); Future -     Veritor Flu A/B Waived -     Rapid Strep Screen ([facility] ONLY) -     Novel Coronavirus, NAA ([facility]) -     Culture, Group A Strep  Congestion of right ear -     Novel Coronavirus, NAA ([facility]); Future -     Veritor Flu A/B Waived -     Novel Coronavirus, NAA ([facility])     Follow Up Instructions: Return to office for new or worsening symptoms, or if symptoms persist.     I discussed the assessment and treatment plan with the patient. The patient was provided an opportunity to ask questions and all were answered. The patient agreed with the plan and demonstrated an understanding of the instructions.   The patient was advised to call back or seek an in-person evaluation if the symptoms worsen or if the condition fails to improve as anticipated.  The above assessment and management plan was discussed with the patient. The patient verbalized understanding of and has agreed to the management plan. Patient is aware to call the clinic if symptoms persist or worsen. Patient is aware when to return to the clinic for a follow-up visit. Patient educated on when it is appropriate to go to the emergency department.   Time call ended:  1406  I provided 11 minutes of  non face-to-face time during this encounter.    Gwenlyn Perking, FNP

## 2020-11-07 LAB — SARS-COV-2, NAA 2 DAY TAT

## 2020-11-07 LAB — NOVEL CORONAVIRUS, NAA: SARS-CoV-2, NAA: NOT DETECTED

## 2021-01-10 ENCOUNTER — Other Ambulatory Visit: Payer: Self-pay | Admitting: Family Medicine

## 2021-01-10 MED ORDER — OSELTAMIVIR PHOSPHATE 75 MG PO CAPS: 75.0000 mg | ORAL_CAPSULE | Freq: Every day | ORAL | 0 refills | Status: DC

## 2021-02-10 ENCOUNTER — Encounter: Payer: Medicare Other | Admitting: Family Medicine

## 2021-03-06 ENCOUNTER — Ambulatory Visit (INDEPENDENT_AMBULATORY_CARE_PROVIDER_SITE_OTHER): Payer: Medicare Other | Admitting: Family Medicine

## 2021-03-06 ENCOUNTER — Encounter: Payer: Self-pay | Admitting: Family Medicine

## 2021-03-06 VITALS — BP 120/84 | HR 97 | Temp 97.2°F | Resp 20 | Ht 74.0 in | Wt 288.0 lb

## 2021-03-06 DIAGNOSIS — I10 Essential (primary) hypertension: Secondary | ICD-10-CM

## 2021-03-06 DIAGNOSIS — L2084 Intrinsic (allergic) eczema: Secondary | ICD-10-CM

## 2021-03-06 DIAGNOSIS — F419 Anxiety disorder, unspecified: Secondary | ICD-10-CM | POA: Diagnosis not present

## 2021-03-06 DIAGNOSIS — M10071 Idiopathic gout, right ankle and foot: Secondary | ICD-10-CM

## 2021-03-06 DIAGNOSIS — Z23 Encounter for immunization: Secondary | ICD-10-CM

## 2021-03-06 MED ORDER — TRIAMCINOLONE ACETONIDE 0.1 % EX CREA: 1.0000 "application " | TOPICAL_CREAM | Freq: Two times a day (BID) | CUTANEOUS | 1 refills | Status: DC

## 2021-03-06 NOTE — Progress Notes (Signed)
BP 120/84    Pulse 97    Temp (!) 97.2 F (36.2 C) (Oral)    Resp 20    Ht _0  (1.88 m)    Wt 288 lb (130.6 kg)    SpO2 95%    BMI 36.98 kg/m    Subjective:   Patient ID: Jeremy Bennett, male    DOB: [birth year], 66 y.o.   MRN: [id number]  HPI: Jeremy Bennett is a 66 y.o. male presenting on 03/06/2021 for Medical Management of Chronic Issues   HPI Hypertension Patient is currently on lisinopril hydrochlorothiazide, and their blood pressure today is 120/84. Patient denies any lightheadedness or dizziness. Patient denies headaches, blurred vision, chest pains, shortness of breath, or weakness. Denies any side effects from medication and is content with current medication.   Gout Last attack: Colchicine as needed, has not had attack in at least 6 months Attacks this year: None Medication: Colchicine as needed Location of attacks: Feet and toes  Anxiety Patient feels like he is doing very well.  He wants to taper off his Paxil.  He feels like he does not need it anymore and he has been stable for quite some time.  Relevant past medical, surgical, family and social history reviewed and updated as indicated. Interim medical history since our last visit reviewed. Allergies and medications reviewed and updated.  Review of Systems  Constitutional:  Negative for chills and fever.  Eyes:  Negative for visual disturbance.  Respiratory:  Negative for shortness of breath and wheezing.   Cardiovascular:  Negative for chest pain and leg swelling.  Musculoskeletal:  Negative for back pain and gait problem.  Skin:  Negative for rash.  Neurological:  Negative for dizziness, weakness and light-headedness.  All other systems reviewed and are negative.  Per HPI unless specifically indicated above   Allergies as of 03/06/2021       Reactions   No Known Allergies         Medication List        Accurate as of March 06, 2021 10:01 AM. If you have any questions, ask your nurse or doctor.           STOP taking these medications    oseltamivir 75 MG capsule Commonly known as: Tamiflu Stopped by: Worthy Rancher, MD       TAKE these medications    colchicine 0.6 MG tablet For acute attack of gout use one at onset, then one every two hours until 6 pills have been taken. Stop sooner if pain is relieved or if nausea occurs. Then revert to BID   lisinopril-hydrochlorothiazide 20-12.5 MG tablet Commonly known as: ZESTORETIC Take 1 tablet by mouth daily.   meclizine 12.5 MG tablet Commonly known as: ANTIVERT Take 1-2 tablets (12.5-25 mg total) by mouth 3 (three) times daily as needed for dizziness.   multivitamin tablet Take 1 tablet by mouth daily.   PARoxetine 20 MG tablet Commonly known as: PAXIL Take 1 tablet (20 mg total) by mouth daily.   Systane 0.4-0.3 % Soln Generic drug: Polyethyl Glycol-Propyl Glycol Place 1 drop into both eyes daily as needed (Dry eyes).   traZODone 50 MG tablet Commonly known as: DESYREL Take 0.5-1 tablets (25-50 mg total) by mouth at bedtime as needed for sleep.   triamcinolone cream 0.1 % Commonly known as: KENALOG Apply 1 application topically 2 (two) times daily. Started by: Worthy Rancher, MD         Objective:  BP 120/84    Pulse 97    Temp (!) 97.2 F (36.2 C) (Oral)    Resp 20    Ht _0  (1.88 m)    Wt 288 lb (130.6 kg)    SpO2 95%    BMI 36.98 kg/m   Wt Readings from Last 3 Encounters:  03/06/21 288 lb (130.6 kg)  08/09/20 (!) 304 lb (137.9 kg)  01/02/20 (!) 311 lb (141.1 kg)    Physical Exam Vitals and nursing note reviewed.  Constitutional:      General: He is not in acute distress.    Appearance: He is well-developed. He is not diaphoretic.  Eyes:     General: No scleral icterus.    Conjunctiva/sclera: Conjunctivae normal.  Cardiovascular:     Rate and Rhythm: Normal rate and regular rhythm.     Heart sounds: Normal heart sounds. No murmur heard. Pulmonary:     Effort: Pulmonary effort  is normal. No respiratory distress.     Breath sounds: Normal breath sounds. No wheezing.  Musculoskeletal:        General: Normal range of motion.     Cervical back: Neck supple.  Skin:    General: Skin is warm and dry.     Findings: No rash.  Neurological:     Mental Status: He is alert and oriented to person, place, and time.     Coordination: Coordination normal.  Psychiatric:        Behavior: Behavior normal.      Assessment & Plan:   Problem List Items Addressed This Visit       Cardiovascular and Mediastinum   Hypertension - Primary   Relevant Orders   CBC with Differential/Platelet   CMP14+EGFR   Lipid panel     Other   Anxiety   Other Visit Diagnoses     Intrinsic eczema       Relevant Medications   triamcinolone cream (KENALOG) 0.1 %   Acute idiopathic gout of right ankle       Relevant Orders   Uric acid       Continue current medicine, will check blood work.  Gave patient instructions on how to taper off Paxil by first cutting in half for 1 week and then if he needs more he can take a half a pill every other day for another week. Follow up plan: Return in about 6 months (around 09/03/2021), or if symptoms worsen or fail to improve, for htn and gout and anxiety.  Counseling provided for all of the vaccine components Orders Placed This Encounter  Procedures   Tdap vaccine greater than or equal to 7yo IM   CBC with Differential/Platelet   CMP14+EGFR   Lipid panel   Uric acid    Caryl Pina, MD [facility] 03/06/2021, 10:01 AM

## 2021-03-07 LAB — CBC WITH DIFFERENTIAL/PLATELET
Basophils Absolute: 0 10*3/uL (ref 0.0–0.2)
Basos: 1 %
EOS (ABSOLUTE): 0.2 10*3/uL (ref 0.0–0.4)
Eos: 4 %
Hematocrit: 51.5 % — ABNORMAL HIGH (ref 37.5–51.0)
Hemoglobin: 17.2 g/dL (ref 13.0–17.7)
Immature Grans (Abs): 0 10*3/uL (ref 0.0–0.1)
Immature Granulocytes: 0 %
Lymphocytes Absolute: 1.6 10*3/uL (ref 0.7–3.1)
Lymphs: 29 %
MCH: 31.7 pg (ref 26.6–33.0)
MCHC: 33.4 g/dL (ref 31.5–35.7)
MCV: 95 fL (ref 79–97)
Monocytes Absolute: 0.4 10*3/uL (ref 0.1–0.9)
Monocytes: 6 %
Neutrophils Absolute: 3.3 10*3/uL (ref 1.4–7.0)
Neutrophils: 60 %
Platelets: 167 10*3/uL (ref 150–450)
RBC: 5.43 x10E6/uL (ref 4.14–5.80)
RDW: 12.5 % (ref 11.6–15.4)
WBC: 5.6 10*3/uL (ref 3.4–10.8)

## 2021-03-07 LAB — LIPID PANEL
Chol/HDL Ratio: 4.7 ratio (ref 0.0–5.0)
Cholesterol, Total: 179 mg/dL (ref 100–199)
HDL: 38 mg/dL — ABNORMAL LOW (ref >39)
LDL Chol Calc (NIH): 100 mg/dL — ABNORMAL HIGH (ref 0–99)
Triglycerides: 243 mg/dL — ABNORMAL HIGH (ref 0–149)
VLDL Cholesterol Cal: 41 mg/dL — ABNORMAL HIGH (ref 5–40)

## 2021-03-07 LAB — CMP14+EGFR
ALT: 45 IU/L — ABNORMAL HIGH (ref 0–44)
AST: 37 IU/L (ref 0–40)
Albumin/Globulin Ratio: 1.9 (ref 1.2–2.2)
Albumin: 5 g/dL — ABNORMAL HIGH (ref 3.8–4.8)
Alkaline Phosphatase: 76 IU/L (ref 44–121)
BUN/Creatinine Ratio: 9 — ABNORMAL LOW (ref 10–24)
BUN: 9 mg/dL (ref 8–27)
Bilirubin Total: 2.6 mg/dL — ABNORMAL HIGH (ref 0.0–1.2)
CO2: 24 mmol/L (ref 20–29)
Calcium: 9.8 mg/dL (ref 8.6–10.2)
Chloride: 101 mmol/L (ref 96–106)
Creatinine, Ser: 0.96 mg/dL (ref 0.76–1.27)
Globulin, Total: 2.6 g/dL (ref 1.5–4.5)
Glucose: 128 mg/dL — ABNORMAL HIGH (ref 70–99)
Potassium: 3.8 mmol/L (ref 3.5–5.2)
Sodium: 140 mmol/L (ref 134–144)
Total Protein: 7.6 g/dL (ref 6.0–8.5)
eGFR: 88 mL/min/{1.73_m2} (ref >59)

## 2021-03-07 LAB — URIC ACID: Uric Acid: 7.5 mg/dL (ref 3.8–8.4)

## 2021-08-02 ENCOUNTER — Other Ambulatory Visit: Payer: Self-pay | Admitting: Family Medicine

## 2021-08-02 DIAGNOSIS — F419 Anxiety disorder, unspecified: Secondary | ICD-10-CM

## 2021-08-14 ENCOUNTER — Ambulatory Visit (INDEPENDENT_AMBULATORY_CARE_PROVIDER_SITE_OTHER): Payer: Medicare Other

## 2021-08-14 VITALS — Wt 275.0 lb

## 2021-08-14 DIAGNOSIS — Z0000 Encounter for general adult medical examination without abnormal findings: Secondary | ICD-10-CM

## 2021-08-14 NOTE — Progress Notes (Signed)
Subjective:   Jeremy Bennett is a 66 y.o. male who presents for Medicare Annual/Subsequent preventive examination.  Virtual Visit via Telephone Note  I connected with  Jeremy Bennett on 08/14/21 at 12:00 PM EDT by telephone and verified that I am speaking with the correct person using two identifiers.  Location: Patient: Home Provider: [facility] Persons participating in the virtual visit: patient/Nurse Health Advisor   I discussed the limitations, risks, security and privacy concerns of performing an evaluation and management service by telephone and the availability of in person appointments. The patient expressed understanding and agreed to proceed.  Interactive audio and video telecommunications were attempted between this nurse and patient, however failed, due to patient having technical difficulties OR patient did not have access to video capability.  We continued and completed visit with audio only.  Some vital signs may be absent or patient reported.   Jeremy Budreau E [name], LPN   Review of Systems     Cardiac Risk Factors include: advanced age (>53mn, >>78women);family history of premature cardiovascular disease;hypertension;male gender;obesity (BMI >30kg/m2)     Objective:    Today's Vitals   08/14/21 1202  Weight: 275 lb (124.7 kg)   Body mass index is 35.31 kg/m.     08/14/2021   12:11 PM 08/13/2020   10:36 AM 12/15/2018    8:19 AM 10/06/2016    9:02 PM 09/29/2016    9:26 AM 05/19/2016    3:39 PM 05/19/2016    6:02 AM  Advanced Directives  Does Patient Have a Medical Advance Directive? No Yes No Yes Yes Yes Yes  Type of Advance Directive  Living will;Healthcare Power of Attorney  Living will;[place]care [place] of [place] of [place]  Does patient want to make changes to medical advance directive?  No - Patient declined  No - Patient declined No - Patient declined No  - Patient declined   Copy of HMottin Chart?  No - copy requested  Yes No - copy requested No - copy requested No - copy requested  Would patient like information on creating a medical advance directive? No - Patient declined No - Patient declined No - Patient declined        Current Medications (verified) Outpatient Encounter Medications as of 08/14/2021  Medication Sig   lisinopril-hydrochlorothiazide (ZESTORETIC) 20-12.5 MG tablet Take 1 tablet by mouth daily.   Multiple Vitamin (MULTIVITAMIN) tablet Take 1 tablet by mouth daily.   PARoxetine (PAXIL) 20 MG tablet Take 1 tablet (20 mg total) by mouth daily.   Polyethyl Glycol-Propyl Glycol (SYSTANE) 0.4-0.3 % SOLN Place 1 drop into both eyes daily as needed (Dry eyes).   traZODone (DESYREL) 50 MG tablet TAKE 0.5-1 TABLETS BY MOUTH AT BEDTIME AS NEEDED FOR SLEEP.   colchicine 0.6 MG tablet For acute attack of gout use one at onset, then one every two hours until 6 pills have been taken. Stop sooner if pain is relieved or if nausea occurs. Then revert to BID (Patient not taking: Reported on 08/14/2021)   meclizine (ANTIVERT) 12.5 MG tablet Take 1-2 tablets (12.5-25 mg total) by mouth 3 (three) times daily as needed for dizziness. (Patient not taking: Reported on 03/06/2021)   triamcinolone cream (KENALOG) 0.1 % Apply 1 application topically 2 (two) times daily. (Patient not taking: Reported on 08/14/2021)   No facility-administered encounter medications on file as of 08/14/2021.    Allergies (verified) No  known allergies   History: Past Medical History:  Diagnosis Date   ADD (attention deficit disorder)    Arthritis    Gout    History of asbestosis    Hypertension    Sleep apnea    tested more than 5 yrs ago   Past Surgical History:  Procedure Laterality Date   COLONOSCOPY     COLONOSCOPY N/A 12/15/2018   Procedure: COLONOSCOPY;  Surgeon: Rogene Houston, MD;  [facility];  Service: Endoscopy;   Laterality: N/A;   FRACTURE SURGERY     left clavicle   1972   KNEE ARTHROSCOPY     right   POLYPECTOMY  12/15/2018   Procedure: POLYPECTOMY;  Surgeon: Rogene Houston, MD;  [facility];  Service: Endoscopy;;  transverse   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   Procedure: TOTAL KNEE ARTHROPLASTY;  Surgeon: Melrose Nakayama, MD;  Location: [place];  Service: Orthopedics;  Laterality: Right;   TOTAL KNEE ARTHROPLASTY Left 10/06/2016   Procedure: TOTAL KNEE ARTHROPLASTY;  Surgeon: Melrose Nakayama, MD;  Location: [place];  Service: Orthopedics;  Laterality: Left;   Family History  Problem Relation Age of Onset   Heart disease Father 60       heart attack   HIV/AIDS Father    Cancer Sister        uterine   Social History   Socioeconomic History   Marital status: Married    Spouse name: Jeremy Bennett   Number of children: Not on file   Years of education: Not on file   Highest education level: Not on file  Occupational History   Not on file  Tobacco Use   Smoking status: Never   Smokeless tobacco: Never  Vaping Use   Vaping Use: Never used  Substance and Sexual Activity   Alcohol use: Yes    Alcohol/week: 1.0 standard drink of alcohol    Types: 1 Cans of beer per week    Comment: in a week's time   Drug use: No   Sexual activity: Not on file  Other Topics Concern   Not on file  Social History Narrative   Not on file   Social Determinants of Health   Financial Resource Strain: Low Risk  (08/14/2021)   Overall Financial Resource Strain (CARDIA)    Difficulty of Paying Living Expenses: Not hard at all  Food Insecurity: No Food Insecurity (08/14/2021)   Hunger Vital Sign    Worried About Running Out of Food in the Last Year: Never true    Ran Out of Food in the Last Year: Never true  Transportation Needs: No Transportation Needs (08/14/2021)   PRAPARE - Hydrologist (Medical): No    Lack of  Transportation (Non-Medical): No  Physical Activity: Sufficiently Active (08/14/2021)   Exercise Vital Sign    Days of Exercise per Week: 5 days    Minutes of Exercise per Session: 30 min  Stress: No Stress Concern Present (08/14/2021)   [place]    Feeling of Stress : Only a little  Social Connections: Moderately Isolated (08/14/2021)   Social Connection and Isolation Panel [NHANES]    Frequency of Communication with Friends and Family: More than three times a week    Frequency of Social Gatherings with Friends and Family: More than three times a week    Attends Religious Services: Never  Active Member of Clubs or Organizations: No    Attends Archivist Meetings: Never    Marital Status: Married    Tobacco Counseling Counseling given: Not Answered   Clinical Intake:  Pre-visit preparation completed: Yes  Pain : No/denies pain     BMI - recorded: 35.31 Nutritional Status: BMI > 30  Obese Nutritional Risks: None Diabetes: No  How often do you need to have someone help you when you read instructions, pamphlets, or other written materials from your doctor or pharmacy?: 1 - Never  Diabetic? no  Interpreter Needed?: No  Information entered by :: Nikka Hakimian, LPN   Activities of Daily Living    08/14/2021   12:10 PM  In your present state of health, do you have any difficulty performing the following activities:  Hearing? 0  Vision? 0  Difficulty concentrating or making decisions? 0  Walking or climbing stairs? 0  Dressing or bathing? 0  Doing errands, shopping? 0  Preparing Food and eating ? N  Using the Toilet? N  In the past six months, have you accidently leaked urine? N  Do you have problems with loss of bowel control? N  Managing your Medications? N  Managing your Finances? N  Housekeeping or managing your Housekeeping? N    Patient Care Team: Dettinger, Fransisca Kaufmann, MD as PCP -  General (Family Medicine)  Indicate any recent Medical Services you may have received from other than [facility] providers in the past year (date may be approximate).     Assessment:   This is a routine wellness examination for Jovaughn.  Hearing/Vision screen Hearing Screening - Comments:: Denies hearing difficulties   Vision Screening - Comments:: Wears rx glasses - up to date with routine eye exams with [facility] [place] issues and exercise activities discussed: Current Exercise Habits: Home exercise routine, Type of exercise: walking, Time (Minutes): 30, Frequency (Times/Week): 5, Weekly Exercise (Minutes/Week): 150, Intensity: Mild, Exercise limited by: orthopedic condition(s)   Goals Addressed             This Visit's Progress    Patient Stated   On track    08/14/2021 AWV Goal: Exercise for General Health  Patient will verbalize understanding of the benefits of increased physical activity: Exercising regularly is important. It will improve your overall fitness, flexibility, and endurance. Regular exercise also will improve your overall health. It can help you control your weight, reduce stress, and improve your bone density. Over the next year, patient will increase physical activity as tolerated with a goal of at least 150 minutes of moderate physical activity per week.  You can tell that you are exercising at a moderate intensity if your heart starts beating faster and you start breathing faster but can still hold a conversation. Moderate-intensity exercise ideas include: Walking 1 mile (1.6 km) in about 15 minutes Biking Hiking Golfing Dancing Water aerobics Patient will verbalize understanding of everyday activities that increase physical activity by providing examples like the following: Yard work, such as: Sales promotion account executive Gardening Washing windows or floors Patient  will be able to explain general safety guidelines for exercising:  Before you start a new exercise program, talk with your health care provider. Do not exercise so much that you hurt yourself, feel dizzy, or get very short of breath. Wear comfortable clothes and wear shoes with good support. Drink plenty of water while you exercise  to prevent dehydration or heat stroke. Work out until your breathing and your heartbeat get faster.             Depression Screen    08/14/2021   12:06 PM 08/13/2020   10:47 AM 08/09/2020    9:31 AM 01/02/2020   10:27 AM 12/02/2018    1:34 PM 02/23/2018    9:50 AM  PHQ 2/9 Scores  PHQ - 2 Score 0 0 0 0 0 0    Fall Risk    08/14/2021   12:04 PM 08/13/2020   10:47 AM 08/09/2020    9:31 AM 01/02/2020   10:27 AM 12/02/2018    1:33 PM  Fall Risk   Falls in the past year? 0 0 0 0 0  Number falls in past yr: 0      Injury with Fall? 0      Risk for fall due to : Orthopedic patient      Follow up Falls prevention discussed Falls evaluation completed       [place]:  Any stairs in or around the home? Yes  If so, are there any without handrails? No  Home free of loose throw rugs in walkways, pet beds, electrical cords, etc? Yes  Adequate lighting in your home to reduce risk of falls? Yes   ASSISTIVE DEVICES UTILIZED TO PREVENT FALLS:  Life alert? No  Use of a cane, walker or w/c? No  Grab bars in the bathroom? No  Shower chair or bench in shower? No  Elevated toilet seat or a handicapped toilet? No   TIMED UP AND GO:  Was the test performed? No . Telephonic visit  Cognitive Function:        08/14/2021   12:09 PM 08/13/2020   10:38 AM  6CIT Screen  What Year? 0 points 0 points  What month? 0 points 0 points  What time? 0 points 0 points  Count back from 20 0 points 0 points  Months in reverse 0 points 0 points  Repeat phrase 0 points 2 points  Total Score 0 points 2 points     Immunizations Immunization History  Administered Date(s) Administered   Influenza,inj,Quad PF,6+ Mos 10/08/2016, 02/23/2018, 12/02/2018, 01/02/2020   PFIZER(Purple Top)SARS-COV-2 Vaccination 10/08/2019, 11/08/2019   Tdap 03/06/2021    TDAP status: Up to date  Flu Vaccine status: Declined, Education has been provided regarding the importance of this vaccine but patient still declined. Advised may receive this vaccine at local pharmacy or Health Dept. Aware to provide a copy of the vaccination record if obtained from local pharmacy or Health Dept. Verbalized acceptance and understanding.  Pneumococcal vaccine status: Declined,  Education has been provided regarding the importance of this vaccine but patient still declined. Advised may receive this vaccine at local pharmacy or Health Dept. Aware to provide a copy of the vaccination record if obtained from local pharmacy or Health Dept. Verbalized acceptance and understanding.   Covid-19 vaccine status: Completed vaccines  Qualifies for Shingles Vaccine? Yes   Zostavax completed No   Shingrix Completed?: No.    Education has been provided regarding the importance of this vaccine. Patient has been advised to call insurance company to determine out of pocket expense if they have not yet received this vaccine. Advised may also receive vaccine at local pharmacy or Health Dept. Verbalized acceptance and understanding.  Screening Tests Health Maintenance  Topic Date Due   Zoster Vaccines- Shingrix (1 of 2) Never done  COVID-19 Vaccine (3 - Pfizer risk series) 12/06/2019   Pneumonia Vaccine 12+ Years old (1 - PCV) 08/05/2021   INFLUENZA VACCINE  08/26/2021   COLONOSCOPY (Pts 45-63yr Insurance coverage will need to be confirmed)  12/15/2023   TETANUS/TDAP  03/07/2031   Hepatitis C Screening  Completed   HPV VACCINES  Aged Out   HIV Screening  Discontinued    Health Maintenance  Health Maintenance Due  Topic Date Due   Zoster  Vaccines- Shingrix (1 of 2) Never done   COVID-19 Vaccine (3 - Pfizer risk series) 12/06/2019   Pneumonia Vaccine 65 Years old (1 - PCV) 08/05/2021    Colorectal cancer screening: Type of screening: Colonoscopy. Completed 12/15/2018. Repeat every 5 years  Lung Cancer Screening: (Low Dose CT Chest recommended if Age 65-80years, 30 pack-year currently smoking OR have quit w/in 15years.) does not qualify.    Additional Screening:  Hepatitis C Screening: does qualify; Completed 02/23/2018  Vision Screening: Recommended annual ophthalmology exams for early detection of glaucoma and other disorders of the eye. Is the patient up to date with their annual eye exam?  Yes  Who is the provider or what is the name of the office in which the patient attends annual eye exams? [email or web address] is not established with a provider, would they like to be referred to a provider to establish care? No .   Dental Screening: Recommended annual dental exams for proper oral hygiene  Community Resource Referral / Chronic Care Management: CRR required this visit?  No   CCM required this visit?  No      Plan:     I have personally reviewed and noted the following in the patient's chart:   Medical and social history Use of alcohol, tobacco or illicit drugs  Current medications and supplements including opioid prescriptions. Patient is not currently taking opioid prescriptions. Functional ability and status Nutritional status Physical activity Advanced directives List of other physicians Hospitalizations, surgeries, and ER visits in previous 12 months Vitals Screenings to include cognitive, depression, and falls Referrals and appointments  In addition, I have reviewed and discussed with patient certain preventive protocols, quality metrics, and best practice recommendations. A written personalized care plan for preventive services as well as general preventive health recommendations  were provided to patient.     ASandrea Hammond LPN   [id number]  Nurse Notes: None

## 2021-08-14 NOTE — Patient Instructions (Signed)
Jeremy Bennett , Thank you for taking time to come for your Medicare Wellness Visit. I appreciate your ongoing commitment to your health goals. Please review the following plan we discussed and let me know if I can assist you in the future.   Screening recommendations/referrals: Colonoscopy: Done 12/15/2018 - Repeat in 5 years  Recommended yearly ophthalmology/optometry visit for glaucoma screening and checkup Recommended yearly dental visit for hygiene and checkup  Vaccinations: Influenza vaccine: Declined - recommended every fall Pneumococcal vaccine: Declined - recommend once per lifetime Prevnar-20 Tdap vaccine: Done 03/06/2021 - Repeat in 10 years Shingles vaccine: Declined - Shingrix is 2 doses 2-6 months apart and over 90% effective     Covid-19: Done 10/08/2019 & 11/08/2019  Advanced directives: Please bring a copy of your health care power of attorney and living will to the office to be added to your chart at your convenience.   Conditions/risks identified:  Keep up the great work! Aim for 30 minutes of exercise or brisk walking, 6-8 glasses of water, and 5 servings of fruits and vegetables each day.   Next appointment: Follow up in one year for your annual wellness visit.   Preventive Care 65 Years and Older, Male  Preventive care refers to lifestyle choices and visits with your health care provider that can promote health and wellness. What does preventive care include? A yearly physical exam. This is also called an annual well check. Dental exams once or twice a year. Routine eye exams. Ask your health care provider how often you should have your eyes checked. Personal lifestyle choices, including: Daily care of your teeth and gums. Regular physical activity. Eating a healthy diet. Avoiding tobacco and drug use. Limiting alcohol use. Practicing safe sex. Taking low doses of aspirin every day. Taking vitamin and mineral supplements as recommended by your health care  provider. What happens during an annual well check? The services and screenings done by your health care provider during your annual well check will depend on your age, overall health, lifestyle risk factors, and family history of disease. Counseling  Your health care provider may ask you questions about your: Alcohol use. Tobacco use. Drug use. Emotional well-being. Home and relationship well-being. Sexual activity. Eating habits. History of falls. Memory and ability to understand (cognition). Work and work Statistician. Screening  You may have the following tests or measurements: Height, weight, and BMI. Blood pressure. Lipid and cholesterol levels. These may be checked every 5 years, or more frequently if you are over 14 years old. Skin check. Lung cancer screening. You may have this screening every year starting at age 36 if you have a 30-pack-year history of smoking and currently smoke or have quit within the past 15 years. Fecal occult blood test (FOBT) of the stool. You may have this test every year starting at age 48. Flexible sigmoidoscopy or colonoscopy. You may have a sigmoidoscopy every 5 years or a colonoscopy every 10 years starting at age 60. Prostate cancer screening. Recommendations will vary depending on your family history and other risks. Hepatitis C blood test. Hepatitis B blood test. Sexually transmitted disease (STD) testing. Diabetes screening. This is done by checking your blood sugar (glucose) after you have not eaten for a while (fasting). You may have this done every 1-3 years. Abdominal aortic aneurysm (AAA) screening. You may need this if you are a current or former smoker. Osteoporosis. You may be screened starting at age 26 if you are at high risk. Talk with your health care  provider about your test results, treatment options, and if necessary, the need for more tests. Vaccines  Your health care provider may recommend certain vaccines, such  as: Influenza vaccine. This is recommended every year. Tetanus, diphtheria, and acellular pertussis (Tdap, Td) vaccine. You may need a Td booster every 10 years. Zoster vaccine. You may need this after age 31. Pneumococcal 13-valent conjugate (PCV13) vaccine. One dose is recommended after age 37. Pneumococcal polysaccharide (PPSV23) vaccine. One dose is recommended after age 49. Talk to your health care provider about which screenings and vaccines you need and how often you need them. This information is not intended to replace advice given to you by your health care provider. Make sure you discuss any questions you have with your health care provider. Document Released: 02/08/2015 Document Revised: 10/02/2015 Document Reviewed: 11/13/2014 Elsevier Interactive Patient Education  2017 [place] Prevention in the Home Falls can cause injuries. They can happen to people of all ages. There are many things you can do to make your home safe and to help prevent falls. What can I do on the outside of my home? Regularly fix the edges of walkways and driveways and fix any cracks. Remove anything that might make you trip as you walk through a door, such as a raised step or threshold. Trim any bushes or trees on the path to your home. Use bright outdoor lighting. Clear any walking paths of anything that might make someone trip, such as rocks or tools. Regularly check to see if handrails are loose or broken. Make sure that both sides of any steps have handrails. Any raised decks and porches should have guardrails on the edges. Have any leaves, snow, or ice cleared regularly. Use sand or salt on walking paths during winter. Clean up any spills in your garage right away. This includes oil or grease spills. What can I do in the bathroom? Use night lights. Install grab bars by the toilet and in the tub and shower. Do not use towel bars as grab bars. Use non-skid mats or decals in the tub or  shower. If you need to sit down in the shower, use a plastic, non-slip stool. Keep the floor dry. Clean up any water that spills on the floor as soon as it happens. Remove soap buildup in the tub or shower regularly. Attach bath mats securely with double-sided non-slip rug tape. Do not have throw rugs and other things on the floor that can make you trip. What can I do in the bedroom? Use night lights. Make sure that you have a light by your bed that is easy to reach. Do not use any sheets or blankets that are too big for your bed. They should not hang down onto the floor. Have a firm chair that has side arms. You can use this for support while you get dressed. Do not have throw rugs and other things on the floor that can make you trip. What can I do in the kitchen? Clean up any spills right away. Avoid walking on wet floors. Keep items that you use a lot in easy-to-reach places. If you need to reach something above you, use a strong step stool that has a grab bar. Keep electrical cords out of the way. Do not use floor polish or wax that makes floors slippery. If you must use wax, use non-skid floor wax. Do not have throw rugs and other things on the floor that can make you trip. What can I  do with my stairs? Do not leave any items on the stairs. Make sure that there are handrails on both sides of the stairs and use them. Fix handrails that are broken or loose. Make sure that handrails are as long as the stairways. Check any carpeting to make sure that it is firmly attached to the stairs. Fix any carpet that is loose or worn. Avoid having throw rugs at the top or bottom of the stairs. If you do have throw rugs, attach them to the floor with carpet tape. Make sure that you have a light switch at the top of the stairs and the bottom of the stairs. If you do not have them, ask someone to add them for you. What else can I do to help prevent falls? Wear shoes that: Do not have high heels. Have  rubber bottoms. Are comfortable and fit you well. Are closed at the toe. Do not wear sandals. If you use a stepladder: Make sure that it is fully opened. Do not climb a closed stepladder. Make sure that both sides of the stepladder are locked into place. Ask someone to hold it for you, if possible. Clearly mark and make sure that you can see: Any grab bars or handrails. First and last steps. Where the edge of each step is. Use tools that help you move around (mobility aids) if they are needed. These include: Canes. Walkers. Scooters. Crutches. Turn on the lights when you go into a dark area. Replace any light bulbs as soon as they burn out. Set up your furniture so you have a clear path. Avoid moving your furniture around. If any of your floors are uneven, fix them. If there are any pets around you, be aware of where they are. Review your medicines with your doctor. Some medicines can make you feel dizzy. This can increase your chance of falling. Ask your doctor what other things that you can do to help prevent falls. This information is not intended to replace advice given to you by your health care provider. Make sure you discuss any questions you have with your health care provider. Document Released: 11/08/2008 Document Revised: 06/20/2015 Document Reviewed: 02/16/2014 Elsevier Interactive Patient Education  2017 [employer].

## 2021-09-10 ENCOUNTER — Other Ambulatory Visit: Payer: Self-pay | Admitting: *Deleted

## 2021-09-10 NOTE — Patient Outreach (Signed)
  Care Coordination   09/10/2021 Name: Jeremy Bennett MRN: [id number] DOB: May 27, 1955   Care Coordination Outreach Attempts:  An unsuccessful telephone outreach was attempted today to offer the patient information about available care coordination services as a benefit of their health plan.   Follow Up Plan:  Additional outreach attempts will be made to offer the patient care coordination information and services.   Encounter Outcome:  No Answer  Care Coordination Interventions Activated:  No   Care Coordination Interventions:  No, not indicated    SIG Geovanni Rahming L. Lavina Hamman, RN, BSN, [place] Coordinator Office number [phone]

## 2021-09-18 ENCOUNTER — Ambulatory Visit: Payer: Self-pay | Admitting: *Deleted

## 2021-09-18 ENCOUNTER — Other Ambulatory Visit: Payer: Self-pay | Admitting: Family Medicine

## 2021-09-18 DIAGNOSIS — L2084 Intrinsic (allergic) eczema: Secondary | ICD-10-CM

## 2021-09-18 DIAGNOSIS — I10 Essential (primary) hypertension: Secondary | ICD-10-CM

## 2021-09-18 NOTE — Patient Outreach (Signed)
  Care Coordination   09/18/2021 Name: Jeremy Bennett MRN: [id number] DOB: [birth year]   Care Coordination Outreach Attempts:  A second unsuccessful outreach was attempted today to offer the patient with information about available care coordination services as a benefit of their health plan.     Follow Up Plan:  Additional outreach attempts will be made to offer the patient care coordination information and services.   Encounter Outcome:  No Answer  Care Coordination Interventions Activated:  No   Care Coordination Interventions:  No, not indicated    SIG Khadeejah Castner L. Lavina Hamman, RN, BSN, [place] Coordinator Office number [phone]

## 2021-09-23 ENCOUNTER — Ambulatory Visit: Payer: Self-pay | Admitting: *Deleted

## 2021-09-23 ENCOUNTER — Encounter: Payer: Self-pay | Admitting: *Deleted

## 2021-09-23 NOTE — Patient Outreach (Addendum)
  Care Coordination   Initial Visit Note   09/23/2021 Name: Jeremy Bennett MRN: [id number] DOB: 1955-08-06  Jeremy Bennett is a 66 y.o. year old male who sees Dettinger, Fransisca Kaufmann, MD for primary care. I spoke with  [name] by phone today.  What matters to the patients health and wellness today?  Denies any issues at this time Confirms he is on disability but doing well Confirms previous outreach from previous [place], [employer]. Agrees to allow this [facility] RN CM to outreach on a 6 month to annual bases  With review of his care gaps he confirms he does not prefer the Shingles, covid nor pneumonia vaccines, Only the flu vaccine is preferred He discussed this with the MD office annual wellness nurse this year    Goals Addressed               This Visit's Progress     Patient Stated     COMPLETED: Manage care gaps/ health maintenance vaccines (THN) (pt-stated)   On track     Care Coordination Interventions: Patient interviewed about adult health maintenance status including  Zostavax Pneumonia Vaccine COVID vaccination    Documented Historical Immunization accuracy confirmed  Historical Immunization for documented Zostavax Pneumonia Vaccine COVID vaccination    Updated vaccines in EPIC patient does not prefer the covid, shingles nor pneumonia vaccines as stated on 09/23/21  He states he updated the pcp annual wellness visit nurse during the tele-visit on 08/14/21  Goal met          SDOH assessments and interventions completed:  Yes  SDOH Interventions Today    Flowsheet Row Most Recent Value  SDOH Interventions   Food Insecurity Interventions Intervention Not Indicated  Housing Interventions Intervention Not Indicated  Stress Interventions Intervention Not Indicated        Care Coordination Interventions Activated:  Yes  Care Coordination Interventions:  Yes, provided   Follow up plan: Follow up call scheduled for 03/26/22   Patient prefers outreaches every 6  months or annual  Encounter Outcome:  Pt. Visit Completed    Sotero Brinkmeyer L. Lavina Hamman, RN, BSN, [place] Coordinator Office number [phone]

## 2021-09-23 NOTE — Patient Instructions (Addendum)
Visit Information  Thank you for taking time to visit with me today. Please don't hesitate to contact me if I can be of assistance to you.   Following are the goals we discussed today:   Goals Addressed               This Visit's Progress     Patient Stated     COMPLETED: Manage care gaps/ health maintenance vaccines (THN) (pt-stated)   On track     Care Coordination Interventions: Patient interviewed about adult health maintenance status including  Zostavax Pneumonia Vaccine COVID vaccination    Documented Historical Immunization accuracy confirmed  Historical Immunization for documented Zostavax Pneumonia Vaccine COVID vaccination    Updated vaccines in EPIC patient does not prefer the covid, shingles nor pneumonia vaccines as stated on 09/23/21  He states he updated the pcp annual wellness visit nurse during the tele-visit on 08/14/21  Goal met          Our next appointment is by telephone on 03/26/22 at 2:30 pm  Please call the care guide team at [phone] if you need to cancel or reschedule your appointment.   If you are experiencing a Mental Health or [place] or need someone to talk to, please call the Suicide and Crisis Lifeline: 988 call the [place] National Suicide Prevention Lifeline: 7754123314 or TTY: (734) 096-4138 TTY 682-296-7606) to talk to a trained counselor call 1-800-273-TALK (toll free, 24 hour hotline) call the [facility]: [phone] call 911   Patient verbalizes understanding of instructions and care plan provided today and agrees to view in [place]. Active MyChart status and patient understanding of how to access instructions and care plan via MyChart confirmed with patient.     The patient has been provided with contact information for the care management team and has been advised to call with any health related questions or concerns.   [place] Lavina Hamman, RN, BSN, [place]  Coordinator Office number [phone]

## 2021-10-06 ENCOUNTER — Ambulatory Visit: Payer: Medicare Other | Admitting: Family Medicine

## 2021-10-06 ENCOUNTER — Encounter: Payer: Self-pay | Admitting: Family Medicine

## 2021-10-06 ENCOUNTER — Ambulatory Visit (INDEPENDENT_AMBULATORY_CARE_PROVIDER_SITE_OTHER): Payer: Medicare Other | Admitting: Family Medicine

## 2021-10-06 DIAGNOSIS — I10 Essential (primary) hypertension: Secondary | ICD-10-CM

## 2021-10-06 DIAGNOSIS — F419 Anxiety disorder, unspecified: Secondary | ICD-10-CM

## 2021-10-06 DIAGNOSIS — G4733 Obstructive sleep apnea (adult) (pediatric): Secondary | ICD-10-CM

## 2021-10-06 MED ORDER — LISINOPRIL-HYDROCHLOROTHIAZIDE 20-12.5 MG PO TABS: 1.0000 | ORAL_TABLET | Freq: Every day | ORAL | 3 refills | Status: DC

## 2021-10-06 NOTE — Progress Notes (Signed)
 Virtual Visit via telephone Note  I connected with Jeremy Bennett on 10/06/21 at 1115 by telephone and verified that I am speaking with the correct person using two identifiers. Jeremy Bennett is currently located at home and patient are currently with her during visit. The provider, Joshua A Dettinger, MD is located in their office at time of visit.  Call ended at 1124  I discussed the limitations, risks, security and privacy concerns of performing an evaluation and management service by telephone and the availability of in person appointments. I also discussed with the patient that there may be a patient responsible charge related to this service. The patient expressed understanding and agreed to proceed.  125/80 History and Present Illness: Hypertension Patient is currently on lisinopril and hctz, and their blood pressure today is unknown. Patient denies any lightheadedness or dizziness. Patient denies headaches, blurred vision, chest pains, shortness of breath, or weakness. Denies any side effects from medication and is content with current medication.   Anxiety recheck Patient tried to taper of using taper to 10mg and has been off of it for 1 month.  He feels like anxiety is doing well and mood is doing well and he is working to change his view of not stressing about things he can't control  1. Anxiety   2. Primary hypertension   3. Essential hypertension   4. OSA (obstructive sleep apnea)     Outpatient Encounter Medications as of 10/06/2021  Medication Sig   colchicine 0.6 MG tablet For acute attack of gout use one at onset, then one every two hours until 6 pills have been taken. Stop sooner if pain is relieved or if nausea occurs. Then revert to BID (Patient not taking: Reported on 08/14/2021)   lisinopril-hydrochlorothiazide (ZESTORETIC) 20-12.5 MG tablet Take 1 tablet by mouth daily.   meclizine (ANTIVERT) 12.5 MG tablet Take 1-2 tablets (12.5-25 mg total) by mouth 3 (three)  times daily as needed for dizziness. (Patient not taking: Reported on 03/06/2021)   Multiple Vitamin (MULTIVITAMIN) tablet Take 1 tablet by mouth daily.   Polyethyl Glycol-Propyl Glycol (SYSTANE) 0.4-0.3 % SOLN Place 1 drop into both eyes daily as needed (Dry eyes).   traZODone (DESYREL) 50 MG tablet TAKE 0.5-1 TABLETS BY MOUTH AT BEDTIME AS NEEDED FOR SLEEP.   triamcinolone cream (KENALOG) 0.1 % APPLY TO AFFECTED AREA TWICE A DAY   [DISCONTINUED] lisinopril-hydrochlorothiazide (ZESTORETIC) 20-12.5 MG tablet TAKE 1 TABLET BY MOUTH EVERY DAY   [DISCONTINUED] PARoxetine (PAXIL) 20 MG tablet Take 1 tablet (20 mg total) by mouth daily.   No facility-administered encounter medications on file as of 10/06/2021.    Review of Systems  Constitutional:  Negative for chills and fever.  Eyes:  Negative for visual disturbance.  Respiratory:  Negative for shortness of breath and wheezing.   Cardiovascular:  Negative for chest pain and leg swelling.  Musculoskeletal:  Negative for back pain and gait problem.  Skin:  Negative for rash.  Neurological:  Negative for dizziness, weakness and light-headedness.  All other systems reviewed and are negative.   Observations/Objective: Patient sounds comfortable and in no acute distress  Assessment and Plan: Problem List Items Addressed This Visit       Cardiovascular and Mediastinum   Hypertension   Relevant Medications   lisinopril-hydrochlorothiazide (ZESTORETIC) 20-12.5 MG tablet   Other Relevant Orders   CBC with Differential/Platelet   CMP14+EGFR   Lipid panel     Other   Anxiety - Primary   Relevant   Orders   CBC with Differential/Platelet   CMP14+EGFR   Other Visit Diagnoses     Essential hypertension       Relevant Medications   lisinopril-hydrochlorothiazide (ZESTORETIC) 20-12.5 MG tablet   Other Relevant Orders   CMP14+EGFR   Lipid panel   OSA (obstructive sleep apnea)       Relevant Orders   Ambulatory referral to Sleep Studies         Follow up plan: No follow-ups on file.     I discussed the assessment and treatment plan with the patient. The patient was provided an opportunity to ask questions and all were answered. The patient agreed with the plan and demonstrated an understanding of the instructions.   The patient was advised to call back or seek an in-person evaluation if the symptoms worsen or if the condition fails to improve as anticipated.  The above assessment and management plan was discussed with the patient. The patient verbalized understanding of and has agreed to the management plan. Patient is aware to call the clinic if symptoms persist or worsen. Patient is aware when to return to the clinic for a follow-up visit. Patient educated on when it is appropriate to go to the emergency department.    I provided 9 minutes of non-face-to-face time during this encounter.    Joshua A Dettinger, MD    

## 2021-10-30 DIAGNOSIS — G4733 Obstructive sleep apnea (adult) (pediatric): Secondary | ICD-10-CM | POA: Diagnosis not present

## 2021-10-31 ENCOUNTER — Other Ambulatory Visit: Payer: Medicare Other

## 2021-10-31 DIAGNOSIS — I10 Essential (primary) hypertension: Secondary | ICD-10-CM

## 2021-10-31 DIAGNOSIS — F419 Anxiety disorder, unspecified: Secondary | ICD-10-CM

## 2021-10-31 DIAGNOSIS — G4733 Obstructive sleep apnea (adult) (pediatric): Secondary | ICD-10-CM | POA: Diagnosis not present

## 2021-11-01 LAB — CMP14+EGFR
ALT: 50 IU/L — ABNORMAL HIGH (ref 0–44)
AST: 33 IU/L (ref 0–40)
Albumin/Globulin Ratio: 1.8 (ref 1.2–2.2)
Albumin: 4.6 g/dL (ref 3.9–4.9)
Alkaline Phosphatase: 72 IU/L (ref 44–121)
BUN/Creatinine Ratio: 13 (ref 10–24)
BUN: 11 mg/dL (ref 8–27)
Bilirubin Total: 1.9 mg/dL — ABNORMAL HIGH (ref 0.0–1.2)
CO2: 21 mmol/L (ref 20–29)
Calcium: 9.5 mg/dL (ref 8.6–10.2)
Chloride: 103 mmol/L (ref 96–106)
Creatinine, Ser: 0.86 mg/dL (ref 0.76–1.27)
Globulin, Total: 2.5 g/dL (ref 1.5–4.5)
Glucose: 155 mg/dL — ABNORMAL HIGH (ref 70–99)
Potassium: 3.7 mmol/L (ref 3.5–5.2)
Sodium: 145 mmol/L — ABNORMAL HIGH (ref 134–144)
Total Protein: 7.1 g/dL (ref 6.0–8.5)
eGFR: 96 mL/min/{1.73_m2} (ref >59)

## 2021-11-01 LAB — CBC WITH DIFFERENTIAL/PLATELET
Basophils Absolute: 0 10*3/uL (ref 0.0–0.2)
Basos: 0 %
EOS (ABSOLUTE): 0.1 10*3/uL (ref 0.0–0.4)
Eos: 2 %
Hematocrit: 46.3 % (ref 37.5–51.0)
Hemoglobin: 16.2 g/dL (ref 13.0–17.7)
Immature Grans (Abs): 0 10*3/uL (ref 0.0–0.1)
Immature Granulocytes: 0 %
Lymphocytes Absolute: 1.4 10*3/uL (ref 0.7–3.1)
Lymphs: 28 %
MCH: 32 pg (ref 26.6–33.0)
MCHC: 35 g/dL (ref 31.5–35.7)
MCV: 92 fL (ref 79–97)
Monocytes Absolute: 0.3 10*3/uL (ref 0.1–0.9)
Monocytes: 5 %
Neutrophils Absolute: 3 10*3/uL (ref 1.4–7.0)
Neutrophils: 65 %
Platelets: 145 10*3/uL — ABNORMAL LOW (ref 150–450)
RBC: 5.06 x10E6/uL (ref 4.14–5.80)
RDW: 13.1 % (ref 11.6–15.4)
WBC: 4.8 10*3/uL (ref 3.4–10.8)

## 2021-11-01 LAB — LIPID PANEL
Chol/HDL Ratio: 4.5 ratio (ref 0.0–5.0)
Cholesterol, Total: 186 mg/dL (ref 100–199)
HDL: 41 mg/dL (ref >39)
LDL Chol Calc (NIH): 108 mg/dL — ABNORMAL HIGH (ref 0–99)
Triglycerides: 214 mg/dL — ABNORMAL HIGH (ref 0–149)
VLDL Cholesterol Cal: 37 mg/dL (ref 5–40)

## 2021-12-04 DIAGNOSIS — G4733 Obstructive sleep apnea (adult) (pediatric): Secondary | ICD-10-CM | POA: Diagnosis not present

## 2022-02-26 ENCOUNTER — Telehealth: Payer: Self-pay | Admitting: Family Medicine

## 2022-02-26 NOTE — Telephone Encounter (Signed)
Pt has been informed and states " you're kidding me" I then asked what was his reasoning for not being able to complete jury duty.  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  Pt states that he is on a BP med with fluid pill and sometimes he has to go to the bathroom every 34m  He also states that he has anxiety. Informed pt that at his recent visit that he was weaning off of his medication and that his anxiety was improving. Informed pt of this and he states that he was off of his anxiety medication for about a month and started "going crazy" so he had to start back. I di not get the name of the medication from the pt.

## 2022-02-26 NOTE — Telephone Encounter (Signed)
Letter completed. Placed up front for pt to pick up. Left message making pt aware.

## 2022-02-26 NOTE — Telephone Encounter (Signed)
Fine you can use the fluid pill and say that he is on a fluid pill and has urinary frequency and cannot be on jury duty for that I guess

## 2022-02-27 NOTE — Telephone Encounter (Signed)
Pt here to pick up letter, reprinted had date letter was due instead of date of week he was to serve on [place] duty.

## 2022-03-15 ENCOUNTER — Other Ambulatory Visit: Payer: Self-pay | Admitting: Family Medicine

## 2022-03-15 DIAGNOSIS — F419 Anxiety disorder, unspecified: Secondary | ICD-10-CM

## 2022-03-16 NOTE — Telephone Encounter (Signed)
Last office visit 10/06/21 Last refill 08/04/21, #90, 1 refill

## 2022-03-23 ENCOUNTER — Encounter: Payer: Self-pay | Admitting: Family Medicine

## 2022-03-23 ENCOUNTER — Ambulatory Visit (INDEPENDENT_AMBULATORY_CARE_PROVIDER_SITE_OTHER): Payer: Medicare Other | Admitting: Family Medicine

## 2022-03-23 VITALS — BP 122/88 | HR 74 | Ht 74.0 in | Wt 273.0 lb

## 2022-03-23 DIAGNOSIS — F419 Anxiety disorder, unspecified: Secondary | ICD-10-CM

## 2022-03-23 DIAGNOSIS — I10 Essential (primary) hypertension: Secondary | ICD-10-CM

## 2022-03-23 DIAGNOSIS — Z23 Encounter for immunization: Secondary | ICD-10-CM

## 2022-03-23 MED ORDER — PAROXETINE HCL 10 MG PO TABS: 10.0000 mg | ORAL_TABLET | Freq: Every day | ORAL | 1 refills | Status: DC

## 2022-03-23 NOTE — Progress Notes (Signed)
BP 122/88   Pulse 74   Ht '6\' 2"'$  (1.88 m)   Wt 273 lb (123.8 kg)   SpO2 97%   BMI 35.05 kg/m    Subjective:   Patient ID: Jeremy Bennett, male    DOB: [birth year], 67 y.o.   MRN: [id number]  HPI: Jeremy Bennett is a 67 y.o. male presenting on 03/23/2022 for Medical Management of Chronic Issues and Hypertension   HPI Hypertension Patient is currently on lisinopril hydrochlorothiazide, and their blood pressure today is 122/88. Patient denies any lightheadedness or dizziness. Patient denies headaches, blurred vision, chest pains, shortness of breath, or weakness. Denies any side effects from medication and is content with current medication.   Anxiety recheck Patient is coming in today for anxiety recheck.  He uses trazodone as needed.  He seems to do well with it.  He tried to taper off the Paxil and got down to 10 mg but could not come off of it and feels like the 10 mg has been doing okay for him.    03/23/2022   12:02 PM 03/23/2022   12:01 PM 09/23/2021    8:08 PM 08/14/2021   12:06 PM 08/13/2020   10:47 AM  Depression screen PHQ 2/9  Decreased Interest  0 0 0 0  Down, Depressed, Hopeless  0 0 0 0  PHQ - 2 Score  0 0 0 0  Altered sleeping 0      Tired, decreased energy 0      Change in appetite 0      Feeling bad or failure about yourself  2      Trouble concentrating 0      Moving slowly or fidgety/restless 0      Suicidal thoughts 0      Difficult doing work/chores Not difficult at all         Relevant past medical, surgical, family and social history reviewed and updated as indicated. Interim medical history since our last visit reviewed. Allergies and medications reviewed and updated.  Review of Systems  Constitutional:  Negative for chills and fever.  Eyes:  Negative for visual disturbance.  Respiratory:  Negative for shortness of breath and wheezing.   Cardiovascular:  Negative for chest pain and leg swelling.  Musculoskeletal:  Negative for back pain and gait  problem.  Skin:  Negative for rash.  Neurological:  Negative for dizziness, weakness and light-headedness.  All other systems reviewed and are negative.   Per HPI unless specifically indicated above   Allergies as of 03/23/2022       Reactions   No Known Allergies         Medication List        Accurate as of March 23, 2022 12:20 PM. If you have any questions, ask your nurse or doctor.          colchicine 0.6 MG tablet For acute attack of gout use one at onset, then one every two hours until 6 pills have been taken. Stop sooner if pain is relieved or if nausea occurs. Then revert to BID   lisinopril-hydrochlorothiazide 20-12.5 MG tablet Commonly known as: ZESTORETIC Take 1 tablet by mouth daily.   meclizine 12.5 MG tablet Commonly known as: ANTIVERT Take 1-2 tablets (12.5-25 mg total) by mouth 3 (three) times daily as needed for dizziness.   multivitamin tablet Take 1 tablet by mouth daily.   PARoxetine 10 MG tablet Commonly known as: Paxil Take 1 tablet (10 mg total) by  mouth daily. Started by: Fransisca Kaufmann Kiyaan Haq, MD   Systane 0.4-0.3 % Soln Generic drug: Polyethyl Glycol-Propyl Glycol Place 1 drop into both eyes daily as needed (Dry eyes).   traZODone 50 MG tablet Commonly known as: DESYREL TAKE 1/2 TO 1 TABLET BY MOUTH AT BEDTIME AS NEEDED FOR SLEEP   triamcinolone cream 0.1 % Commonly known as: KENALOG APPLY TO AFFECTED AREA TWICE A DAY         Objective:   BP 122/88   Pulse 74   Ht '6\' 2"'$  (1.88 m)   Wt 273 lb (123.8 kg)   SpO2 97%   BMI 35.05 kg/m   Wt Readings from Last 3 Encounters:  03/23/22 273 lb (123.8 kg)  08/14/21 275 lb (124.7 kg)  03/06/21 288 lb (130.6 kg)    Physical Exam Vitals and nursing note reviewed.  Constitutional:      General: He is not in acute distress.    Appearance: He is well-developed. He is not diaphoretic.  Eyes:     General: No scleral icterus.    Conjunctiva/sclera: Conjunctivae normal.  Neck:      Thyroid: No thyromegaly.  Cardiovascular:     Rate and Rhythm: Normal rate and regular rhythm.     Heart sounds: Normal heart sounds. No murmur heard. Pulmonary:     Effort: Pulmonary effort is normal. No respiratory distress.     Breath sounds: Normal breath sounds. No wheezing.  Musculoskeletal:        General: No swelling. Normal range of motion.     Cervical back: Neck supple.  Lymphadenopathy:     Cervical: No cervical adenopathy.  Skin:    General: Skin is warm and dry.     Findings: No rash.  Neurological:     Mental Status: He is alert and oriented to person, place, and time.     Coordination: Coordination normal.  Psychiatric:        Behavior: Behavior normal.     Assessment & Plan:   Problem List Items Addressed This Visit       Cardiovascular and Mediastinum   Hypertension - Primary   Relevant Orders   CBC with Differential/Platelet   CMP14+EGFR   Lipid panel     Other   Anxiety   Relevant Medications   PARoxetine (PAXIL) 10 MG tablet    Continue Paxil 10 mg.  Continue blood pressure medicine, looks like is doing well.  Will check blood work today.  No changes Follow up plan: Return in about 6 months (around 09/21/2022), or if symptoms worsen or fail to improve, for Physical and hypertension.  Counseling provided for all of the vaccine components Orders Placed This Encounter  Procedures   CBC with Differential/Platelet   CMP14+EGFR   Lipid panel    Caryl Pina, MD [facility] 03/23/2022, 12:20 PM

## 2022-03-24 LAB — LIPID PANEL
Chol/HDL Ratio: 4.5 ratio (ref 0.0–5.0)
Cholesterol, Total: 189 mg/dL (ref 100–199)
HDL: 42 mg/dL (ref >39)
LDL Chol Calc (NIH): 106 mg/dL — ABNORMAL HIGH (ref 0–99)
Triglycerides: 238 mg/dL — ABNORMAL HIGH (ref 0–149)
VLDL Cholesterol Cal: 41 mg/dL — ABNORMAL HIGH (ref 5–40)

## 2022-03-24 LAB — CBC WITH DIFFERENTIAL/PLATELET
Basophils Absolute: 0 10*3/uL (ref 0.0–0.2)
Basos: 1 %
EOS (ABSOLUTE): 0.2 10*3/uL (ref 0.0–0.4)
Eos: 4 %
Hematocrit: 48.9 % (ref 37.5–51.0)
Hemoglobin: 16.7 g/dL (ref 13.0–17.7)
Immature Grans (Abs): 0 10*3/uL (ref 0.0–0.1)
Immature Granulocytes: 0 %
Lymphocytes Absolute: 1.5 10*3/uL (ref 0.7–3.1)
Lymphs: 27 %
MCH: 31.8 pg (ref 26.6–33.0)
MCHC: 34.2 g/dL (ref 31.5–35.7)
MCV: 93 fL (ref 79–97)
Monocytes Absolute: 0.4 10*3/uL (ref 0.1–0.9)
Monocytes: 7 %
Neutrophils Absolute: 3.4 10*3/uL (ref 1.4–7.0)
Neutrophils: 61 %
Platelets: 164 10*3/uL (ref 150–450)
RBC: 5.25 x10E6/uL (ref 4.14–5.80)
RDW: 13.2 % (ref 11.6–15.4)
WBC: 5.6 10*3/uL (ref 3.4–10.8)

## 2022-03-24 LAB — CMP14+EGFR
ALT: 30 IU/L (ref 0–44)
AST: 24 IU/L (ref 0–40)
Albumin/Globulin Ratio: 2.1 (ref 1.2–2.2)
Albumin: 4.6 g/dL (ref 3.9–4.9)
Alkaline Phosphatase: 77 IU/L (ref 44–121)
BUN/Creatinine Ratio: 11 (ref 10–24)
BUN: 10 mg/dL (ref 8–27)
Bilirubin Total: 1.3 mg/dL — ABNORMAL HIGH (ref 0.0–1.2)
CO2: 24 mmol/L (ref 20–29)
Calcium: 9.6 mg/dL (ref 8.6–10.2)
Chloride: 106 mmol/L (ref 96–106)
Creatinine, Ser: 0.9 mg/dL (ref 0.76–1.27)
Globulin, Total: 2.2 g/dL (ref 1.5–4.5)
Glucose: 106 mg/dL — ABNORMAL HIGH (ref 70–99)
Potassium: 3.9 mmol/L (ref 3.5–5.2)
Sodium: 144 mmol/L (ref 134–144)
Total Protein: 6.8 g/dL (ref 6.0–8.5)
eGFR: 95 mL/min/{1.73_m2} (ref >59)

## 2022-03-26 ENCOUNTER — Ambulatory Visit: Payer: Self-pay | Admitting: *Deleted

## 2022-03-26 NOTE — Patient Outreach (Addendum)
  Care Coordination   Follow Up Visit Note   04/06/2022 Late entry for 03/26/22 Name: Jeremy Bennett MRN: [id number] DOB: 06-Oct-1955  Jeremy Bennett is a 67 y.o. year old male who sees Dettinger, Fransisca Kaufmann, MD for primary care. I spoke with  Jeremy Bennett by phone today.  What matters to the patients health and wellness today?  Congestion worst in morning  Mucus now murky white vs yellow in color  No pulmonologist no cardiology At dentist tooth abscess took amoxicillin Return to Paxil after mind began to race remain on Paxil 10 mg  Ordered a generic mouth piece  18 months loss 30 lbs -more active, increase fluids Knees okay Sitting in a regular chair only tolerated less than 15 minutes  Goals Addressed             This Visit's Progress    manage congestion at home [facility])   On track    Interventions Today    Flowsheet Row Most Recent Value  Chronic Disease   Chronic disease during today's visit Other, Hypertension (HTN)  [congestion]  General Interventions   General Interventions Discussed/Reviewed General Interventions Discussed, Sick Day Rules, Vaccines, Doctor Visits  Vaccines Flu  Doctor Visits Discussed/Reviewed Doctor Visits Reviewed, PCP, Specialist, Doctor Visits Discussed  PCP/Specialist Visits Compliance with follow-up visit  Exercise Interventions   Exercise Discussed/Reviewed Physical Activity  Physical Activity Discussed/Reviewed Physical Activity Reviewed, Home Exercise Program (HEP)  Education Interventions   Education Provided Provided Web-based Education  [sinus infection, sinus cleansing]  Mental Health Interventions   Mental Health Discussed/Reviewed Mental Health Discussed, Coping Strategies  Nutrition Interventions   Nutrition Discussed/Reviewed Nutrition Reviewed, Fluid intake  Pharmacy Interventions   Pharmacy Dicussed/Reviewed Pharmacy Topics Discussed, Medications and their functions             Patient Active Problem List   Diagnosis Date  Noted   Special screening for malignant neoplasms, colon 09/26/2018   Anxiety 02/23/2018   Hypertension 02/23/2018   Primary localized osteoarthritis of left knee 10/06/2016   Primary osteoarthritis of left knee 10/06/2016   Primary localized osteoarthritis of right knee 05/19/2016   Primary osteoarthritis of right knee 05/19/2016    SDOH assessments and interventions completed:  No     Care Coordination Interventions:  Yes, provided   Follow up plan: Follow up call scheduled for 09/24/22    Encounter Outcome:  Pt. Visit Completed   Jeremy Bennett L. Jeremy Hamman, RN, BSN, [place] Coordinator Office number [phone]

## 2022-03-26 NOTE — Patient Outreach (Signed)
  Care Coordination   03/26/2022 Name: Jeremy Bennett MRN: [id number] DOB: Nov 05, 1955   Care Coordination Outreach Attempts:  An unsuccessful telephone outreach was attempted today to offer the patient information about available care coordination services as a benefit of their health plan.   Follow Up Plan:  Additional outreach attempts will be made to offer the patient care coordination information and services.   Encounter Outcome:  No Answer   Care Coordination Interventions:  No, not indicated     Jillyan Plitt L. Lavina Hamman, RN, BSN, [place] Coordinator Office number [phone]

## 2022-03-26 NOTE — Patient Instructions (Addendum)
Visit Information  Thank you for taking time to visit with me today. Please don't hesitate to contact me if I can be of assistance to you.   Following are the goals we discussed today:   Goals Addressed             This Visit's Progress    manage congestion at home [facility])   On track    Interventions Today    Flowsheet Row Most Recent Value  Chronic Disease   Chronic disease during today's visit Other, Hypertension (HTN)  [congestion]  General Interventions   General Interventions Discussed/Reviewed General Interventions Discussed, Sick Day Rules, Vaccines, Doctor Visits  Vaccines Flu  Doctor Visits Discussed/Reviewed Doctor Visits Reviewed, PCP, Specialist, Doctor Visits Discussed  PCP/Specialist Visits Compliance with follow-up visit  Exercise Interventions   Exercise Discussed/Reviewed Physical Activity  Physical Activity Discussed/Reviewed Physical Activity Reviewed, Home Exercise Program (HEP)  Education Interventions   Education Provided Provided Web-based Education  [sinus infection, sinus cleansing]  Mental Health Interventions   Mental Health Discussed/Reviewed Mental Health Discussed, Coping Strategies  Nutrition Interventions   Nutrition Discussed/Reviewed Nutrition Reviewed, Fluid intake  Pharmacy Interventions   Pharmacy Dicussed/Reviewed Pharmacy Topics Discussed, Medications and their functions              Our next appointment is by telephone on 09/24/22 at 1:15 pm  Please call the care guide team at [phone] if you need to cancel or reschedule your appointment.   If you are experiencing a Mental Health or [place] or need someone to talk to, please call the Suicide and Crisis Lifeline: 988 call the [place] National Suicide Prevention Lifeline: 610-032-3537 or TTY: (760)557-9093 TTY (670)254-9973) to talk to a trained counselor call 1-800-273-TALK (toll free, 24 hour hotline) go to [facility] [address], [place] [phone]) call the [place]: ([phone] call 911   Patient verbalizes understanding of instructions and care plan provided today and agrees to view in [place]. Active MyChart status and patient understanding of how to access instructions and care plan via MyChart confirmed with patient.     The patient has been provided with contact information for the care management team and has been advised to call with any health related questions or concerns.   Jasiya Markie L. Lavina Hamman, RN, BSN, [place] Coordinator Office number [phone]

## 2022-04-03 ENCOUNTER — Encounter: Payer: Self-pay | Admitting: *Deleted

## 2022-09-09 ENCOUNTER — Other Ambulatory Visit: Payer: Self-pay | Admitting: Family Medicine

## 2022-09-09 DIAGNOSIS — F419 Anxiety disorder, unspecified: Secondary | ICD-10-CM

## 2022-09-22 ENCOUNTER — Ambulatory Visit (INDEPENDENT_AMBULATORY_CARE_PROVIDER_SITE_OTHER): Payer: Medicare Other

## 2022-09-22 VITALS — Ht 74.0 in | Wt 270.0 lb

## 2022-09-22 DIAGNOSIS — Z0000 Encounter for general adult medical examination without abnormal findings: Secondary | ICD-10-CM | POA: Diagnosis not present

## 2022-09-22 NOTE — Patient Instructions (Signed)
Mr. Cockerell , Thank you for taking time to come for your Medicare Wellness Visit. I appreciate your ongoing commitment to your health goals. Please review the following plan we discussed and let me know if I can assist you in the future.   Referrals/Orders/Follow-Ups/Clinician Recommendations: Aim for 30 minutes of exercise or brisk walking, 6-8 glasses of water, and 5 servings of fruits and vegetables each day.   This is a list of the screening recommended for you and due dates:  Health Maintenance  Topic Date Due   Flu Shot  08/27/2022   Medicare Annual Wellness Visit  09/22/2023   Colon Cancer Screening  12/15/2023   DTaP/Tdap/Td vaccine (2 - Td or Tdap) 03/07/2031   Hepatitis C Screening  Completed   HPV Vaccine  Aged Out   Pneumonia Vaccine  Discontinued   COVID-19 Vaccine  Discontinued   Zoster (Shingles) Vaccine  Discontinued    Advanced directives: (Provided) Advance directive discussed with you today. I have provided a copy for you to complete at home and have notarized. Once this is complete, please bring a copy in to our office so we can scan it into your chart. Information on Advanced Care Planning can be found at [facility] [place] Advance Health Care Directives Advance Health Care Directives ([email or web address]    Next Medicare Annual Wellness Visit scheduled for next year: Yes  insert Preventive Care attachment Insert FALL PREVENTION attachment if needed

## 2022-09-22 NOTE — Progress Notes (Signed)
Subjective:   Jeremy Bennett is a 67 y.o. male who presents for Medicare Annual/Subsequent preventive examination.  Visit Complete: Virtual  I connected with  [name] on 09/22/22 by a audio enabled telemedicine application and verified that I am speaking with the correct person using two identifiers.  Patient Location: Home  Provider Location: Home Office  I discussed the limitations of evaluation and management by telemedicine. The patient expressed understanding and agreed to proceed.  Patient Medicare AWV questionnaire was completed by the patient on 09/22/2022; I have confirmed that all information answered by patient is correct and no changes since this date.  Review of Systems    Vital Signs: Unable to obtain new vitals due to this being a telehealth visit.  Cardiac Risk Factors include: advanced age (>59men, >60 women);dyslipidemia;male gender;hypertension     Objective:    Today's Vitals   09/22/22 1137  Weight: 270 lb (122.5 kg)  Height: 6\' 2"  (1.88 m)   Body mass index is 34.67 kg/m.     09/22/2022   11:42 AM 08/14/2021   12:11 PM 08/13/2020   10:36 AM 12/15/2018    8:19 AM 10/06/2016    9:02 PM 09/29/2016    9:26 AM 05/19/2016    3:39 PM  Advanced Directives  Does Patient Have a Medical Advance Directive? Yes No Yes No Yes Yes Yes  Type of Estate agent of [place];Living will  Living will;Healthcare Power of Attorney  Living will;Healthcare Power of [employer] Power of [place];Living will Living will;Healthcare Power of Attorney  Does patient want to make changes to medical advance directive?   No - Patient declined  No - Patient declined No - Patient declined No - Patient declined  Copy of Healthcare Power of Attorney in Chart? No - copy requested  No - copy requested  Yes No - copy requested No - copy requested  Would patient like information on creating a medical advance directive?  No - Patient declined No - Patient  declined No - Patient declined       Current Medications (verified) Outpatient Encounter Medications as of 09/22/2022  Medication Sig   colchicine 0.6 MG tablet For acute attack of gout use one at onset, then one every two hours until 6 pills have been taken. Stop sooner if pain is relieved or if nausea occurs. Then revert to BID   lisinopril-hydrochlorothiazide (ZESTORETIC) 20-12.5 MG tablet Take 1 tablet by mouth daily.   meclizine (ANTIVERT) 12.5 MG tablet Take 1-2 tablets (12.5-25 mg total) by mouth 3 (three) times daily as needed for dizziness.   Multiple Vitamin (MULTIVITAMIN) tablet Take 1 tablet by mouth daily.   PARoxetine (PAXIL) 10 MG tablet TAKE 1 TABLET BY MOUTH EVERY DAY   Polyethyl Glycol-Propyl Glycol (SYSTANE) 0.4-0.3 % SOLN Place 1 drop into both eyes daily as needed (Dry eyes).   traZODone (DESYREL) 50 MG tablet TAKE 1/2 TO 1 TABLET BY MOUTH AT BEDTIME AS NEEDED FOR SLEEP   triamcinolone cream (KENALOG) 0.1 % APPLY TO AFFECTED AREA TWICE A DAY   No facility-administered encounter medications on file as of 09/22/2022.    Allergies (verified) No known allergies   History: Past Medical History:  Diagnosis Date   ADD (attention deficit disorder)    Arthritis    Gout    History of asbestosis    Hypertension    Sleep apnea    tested more than 5 yrs ago   Past Surgical History:  Procedure Laterality Date  COLONOSCOPY     COLONOSCOPY N/A 12/15/2018   Procedure: COLONOSCOPY;  Surgeon: [name], MD;  [facility];  Service: Endoscopy;  Laterality: N/A;   FRACTURE SURGERY     left clavicle   1972   KNEE ARTHROSCOPY     right   POLYPECTOMY  12/15/2018   Procedure: POLYPECTOMY;  Surgeon: [name], MD;  Location: [facility];  Service: Endoscopy;;  transverse   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   Procedure: TOTAL KNEE ARTHROPLASTY;  Surgeon: [name], MD;  Location: [facility];  Service:  Orthopedics;  Laterality: Right;   TOTAL KNEE ARTHROPLASTY Left 10/06/2016   Procedure: TOTAL KNEE ARTHROPLASTY;  Surgeon: [name], MD;  Location: [facility] OR;  Service: Orthopedics;  Laterality: Left;   Family History  Problem Relation Age of Onset   Heart disease Father 6       heart attack   HIV/AIDS Father    Cancer Sister        uterine   Social History   Socioeconomic History   Marital status: Married    Spouse name: [name]   Number of children: Not on file   Years of education: Not on file   Highest education level: Not on file  Occupational History   Not on file  Tobacco Use   Smoking status: Never   Smokeless tobacco: Never  Vaping Use   Vaping status: Never Used  Substance and Sexual Activity   Alcohol use: Yes    Alcohol/week: 1.0 standard drink of alcohol    Types: 1 Cans of beer per week    Comment: in a week's time   Drug use: No   Sexual activity: Not on file  Other Topics Concern   Not on file  Social History Narrative   They own property at [facility] and visit there frequently   Social Determinants of Health   Financial Resource Strain: Low Risk  (09/22/2022)   Overall Financial Resource Strain (CARDIA)    Difficulty of Paying Living Expenses: Not hard at all  Food Insecurity: No Food Insecurity (09/22/2022)   Hunger Vital Sign    Worried About Running Out of Food in the Last Year: Never true    Ran Out of Food in the Last Year: Never true  Transportation Needs: No Transportation Needs (09/22/2022)   PRAPARE - Administrator, Civil Service (Medical): No    Lack of Transportation (Non-Medical): No  Physical Activity: Insufficiently Active (09/22/2022)   Exercise Vital Sign    Days of Exercise per Week: 3 days    Minutes of Exercise per Session: 30 min  Stress: No Stress Concern Present (09/22/2022)   [employer] of Occupational Health - Occupational Stress Questionnaire    Feeling of Stress : Not at all  Social Connections:  Moderately Integrated (09/22/2022)   Social Connection and Isolation Panel [NHANES]    Frequency of Communication with Friends and Family: More than three times a week    Frequency of Social Gatherings with Friends and Family: More than three times a week    Attends Religious Services: More than 4 times per year    Active Member of [employer] or Organizations: Yes    Attends Engineer, structural: More than 4 times per year    Marital Status: Separated    Tobacco Counseling Counseling given: Not Answered   Clinical Intake:  Pre-visit preparation completed: Yes  Pain :  No/denies pain     Nutritional Risks: None Diabetes: No  How often do you need to have someone help you when you read instructions, pamphlets, or other written materials from your doctor or pharmacy?: 1 - Never  Interpreter Needed?: No  Information entered by :: Renie Ora, LPN   Activities of Daily Living    09/22/2022   11:42 AM 09/18/2022    9:48 AM  In your present state of health, do you have any difficulty performing the following activities:  Hearing? 0 0  Vision? 0 0  Difficulty concentrating or making decisions? 0 0  Walking or climbing stairs? 0 0  Dressing or bathing? 0 0  Doing errands, shopping? 0 0  Preparing Food and eating ? N N  Using the Toilet? N N  In the past six months, have you accidently leaked urine? N N  Do you have problems with loss of bowel control? N N  Managing your Medications? N N  Managing your Finances? N N  Housekeeping or managing your Housekeeping? N N    Patient Care Team: Dettinger, Elige Radon, MD as PCP - General (Family Medicine) Clinton Gallant, RN as [facility] Care Management  Indicate any recent Medical Services you may have received from other than [facility] providers in the past year (date may be approximate).     Assessment:   This is a routine wellness examination for Eliab.  Hearing/Vision screen Vision Screening - Comments::  Wears rx glasses - up to date with routine eye exams with  Dr.Johnson   Dietary issues and exercise activities discussed:     Goals Addressed             This Visit's Progress    Patient Stated   On track    08/14/2021 AWV Goal: Exercise for General Health  Patient will verbalize understanding of the benefits of increased physical activity: Exercising regularly is important. It will improve your overall fitness, flexibility, and endurance. Regular exercise also will improve your overall health. It can help you control your weight, reduce stress, and improve your bone density. Over the next year, patient will increase physical activity as tolerated with a goal of at least 150 minutes of moderate physical activity per week.  You can tell that you are exercising at a moderate intensity if your heart starts beating faster and you start breathing faster but can still hold a conversation. Moderate-intensity exercise ideas include: Walking 1 mile (1.6 km) in about 15 minutes Biking Hiking Golfing Dancing Water aerobics Patient will verbalize understanding of everyday activities that increase physical activity by providing examples like the following: Yard work, such as: Insurance underwriter Gardening Washing windows or floors Patient will be able to explain general safety guidelines for exercising:  Before you start a new exercise program, talk with your health care provider. Do not exercise so much that you hurt yourself, feel dizzy, or get very short of breath. Wear comfortable clothes and wear shoes with good support. Drink plenty of water while you exercise to prevent dehydration or heat stroke. Work out until your breathing and your heartbeat get faster.             Depression Screen    09/22/2022   11:40 AM 03/23/2022   12:01 PM 09/23/2021    8:08 PM 08/14/2021   12:06 PM 08/13/2020    10:47 AM 08/09/2020    9:31  AM 01/02/2020   10:27 AM  PHQ 2/9 Scores  PHQ - 2 Score 0 0 0 0 0 0 0    Fall Risk    09/22/2022   11:39 AM 09/18/2022    9:48 AM 08/25/2022   11:03 AM 03/23/2022   12:00 PM 08/14/2021   12:04 PM  Fall Risk   Falls in the past year? 0 0 0 0 0  Number falls in past yr: 0 0 0  0  Injury with Fall? 0 0 0  0  Risk for fall due to : No Fall Risks    Orthopedic patient  Follow up Falls prevention discussed    Falls prevention discussed    MEDICARE RISK AT HOME: Medicare Risk at Home Any stairs in or around the home?: No If so, are there any without handrails?: No Home free of loose throw rugs in walkways, pet beds, electrical cords, etc?: Yes Adequate lighting in your home to reduce risk of falls?: Yes Life alert?: No Use of a cane, walker or w/c?: No Grab bars in the bathroom?: Yes Shower chair or bench in shower?: Yes Elevated toilet seat or a handicapped toilet?: Yes  TIMED UP AND GO:  Was the test performed?  No    Cognitive Function:        09/22/2022   11:42 AM 08/14/2021   12:09 PM 08/13/2020   10:38 AM  6CIT Screen  What Year? 0 points 0 points 0 points  What month? 0 points 0 points 0 points  What time? 0 points 0 points 0 points  Count back from 20 0 points 0 points 0 points  Months in reverse 0 points 0 points 0 points  Repeat phrase 0 points 0 points 2 points  Total Score 0 points 0 points 2 points    Immunizations Immunization History  Administered Date(s) Administered   Fluad Quad(high Dose 65+) 03/23/2022   Influenza,inj,Quad PF,6+ Mos 10/08/2016, 02/23/2018, 12/02/2018, 01/02/2020   PFIZER(Purple Top)SARS-COV-2 Vaccination 10/08/2019, 11/08/2019   Tdap 03/06/2021    TDAP status: Up to date  Flu Vaccine status: Up to date  Pneumococcal vaccine status: Due, Education has been provided regarding the importance of this vaccine. Advised may receive this vaccine at local pharmacy or Health Dept. Aware to provide a copy of the  vaccination record if obtained from local pharmacy or Health Dept. Verbalized acceptance and understanding.  Covid-19 vaccine status: Completed vaccines  Qualifies for Shingles Vaccine? Yes   Zostavax completed No   Shingrix Completed?: No.    Education has been provided regarding the importance of this vaccine. Patient has been advised to call insurance company to determine out of pocket expense if they have not yet received this vaccine. Advised may also receive vaccine at local pharmacy or Health Dept. Verbalized acceptance and understanding.  Screening Tests Health Maintenance  Topic Date Due   INFLUENZA VACCINE  08/27/2022   Medicare Annual Wellness (AWV)  09/22/2023   Colonoscopy  12/15/2023   DTaP/Tdap/Td (2 - Td or Tdap) 03/07/2031   Hepatitis C Screening  Completed   HPV VACCINES  Aged Out   Pneumonia Vaccine 40+ Years old  Discontinued   COVID-19 Vaccine  Discontinued   Zoster Vaccines- Shingrix  Discontinued    Health Maintenance  Health Maintenance Due  Topic Date Due   INFLUENZA VACCINE  08/27/2022    Colorectal cancer screening: Type of screening: Colonoscopy. Completed 12/15/2018. Repeat every 5 years  Lung Cancer Screening: (Low Dose CT Chest recommended if  Age 94-80 years, 20 pack-year currently smoking OR have quit w/in 15years.) does not qualify.   Lung Cancer Screening Referral: n/a  Additional Screening:  Hepatitis C Screening: does not qualify; Completed 02/23/2018  Vision Screening: Recommended annual ophthalmology exams for early detection of glaucoma and other disorders of the eye. Is the patient up to date with their annual eye exam?  Yes  Who is the provider or what is the name of the office in which the patient attends annual eye exams? Dr.Johnson  If pt is not established with a provider, would they like to be referred to a provider to establish care? No .   Dental Screening: Recommended annual dental exams for proper oral  hygiene   Community Resource Referral / Chronic Care Management: CRR required this visit?  No   CCM required this visit?  No     Plan:     I have personally reviewed and noted the following in the patient's chart:   Medical and social history Use of alcohol, tobacco or illicit drugs  Current medications and supplements including opioid prescriptions. Patient is not currently taking opioid prescriptions. Functional ability and status Nutritional status Physical activity Advanced directives List of other physicians Hospitalizations, surgeries, and ER visits in previous 12 months Vitals Screenings to include cognitive, depression, and falls Referrals and appointments  In addition, I have reviewed and discussed with patient certain preventive protocols, quality metrics, and best practice recommendations. A written personalized care plan for preventive services as well as general preventive health recommendations were provided to patient.     Lorrene Reid, LPN   4/54/0981   After Visit Summary: (MyChart) Due to this being a telephonic visit, the after visit summary with patients personalized plan was offered to patient via MyChart   Nurse Notes: none

## 2022-09-24 ENCOUNTER — Encounter: Payer: Self-pay | Admitting: *Deleted

## 2022-09-24 ENCOUNTER — Ambulatory Visit: Payer: Self-pay | Admitting: *Deleted

## 2022-09-24 NOTE — Patient Outreach (Signed)
  Care Coordination   Follow Up Visit Note   09/24/2022 Name: Jeremy Bennett MRN: [id number] DOB: Jul 16, 1955  Jeremy Bennett is a 67 y.o. year old male who sees Dettinger, Elige Radon, MD for primary care. I spoke with  Jeremy Bennett by phone today.  What matters to the patients health and wellness today?  Congestion, MDs, tooth abscess, sagging skin Congestion-  MDs not needing  Tooth abscess  Sagging skin   -decrease alcohol, walking a mile a day, walk during commercials- 5 minutes Gout home interventions manages to keep his uric acid maintain,Trigger is over use of his feet especially the heel   Goals Addressed             This Visit's Progress    manage hypertension, congestion, tooth concerns at home [facility])       Interventions Today    Flowsheet Row Most Recent Value  Chronic Disease   Chronic disease during today's visit Other  [Congestion, MDs, tooth abscess, sagging skin]  General Interventions   General Interventions Discussed/Reviewed General Interventions Reviewed  Doctor Visits Discussed/Reviewed Doctor Visits Reviewed, PCP  PCP/Specialist Visits Compliance with follow-up visit  Exercise Interventions   Exercise Discussed/Reviewed Exercise Reviewed, Physical Activity  Physical Activity Discussed/Reviewed Physical Activity Reviewed, Types of exercise  [walking, chair exercises, activity during 5 minute tv commercial breaks discussed]  Education Interventions   Education Provided Provided Web-based Education, Provided Education  Provided Verbal Education On Other  [congestion, tooth abscess, skin care info sent]  Mental Health Interventions   Mental Health Discussed/Reviewed Mental Health Reviewed, Coping Strategies  Nutrition Interventions   Nutrition Discussed/Reviewed Nutrition Reviewed, Fluid intake  Pharmacy Interventions   Pharmacy Dicussed/Reviewed Pharmacy Topics Reviewed, Affording Medications  Safety Interventions   Safety Discussed/Reviewed Safety  Discussed  [encourged safety]              SDOH assessments and interventions completed:  No  SDOH Interventions Today    Flowsheet Row Most Recent Value  SDOH Interventions   Financial Strain Interventions Intervention Not Indicated  Social Connections Interventions Intervention Not Indicated, Other (Comment)  [offered resources for divorce but he reports he is doing ok Divorce process is going well]        Care Coordination Interventions:  Yes, provided   Follow up plan: Follow up call scheduled for 12/25/22    Encounter Outcome:  Pt. Visit Completed   Michiko Lineman L. Noelle Penner, RN, BSN, CCM, Care Management Coordinator [phone]

## 2022-09-24 NOTE — Patient Instructions (Addendum)
Visit Information  Thank you for taking time to visit with me today. Please don't hesitate to contact me if I can be of assistance to you.   Following are the goals we discussed today:   Patient will continue managing his Hypertension at home Patient will reach out to pcp or RN CM for concerns with worsening congestion, tooth pain or social concern in between appointments as needed   Goals Addressed             This Visit's Progress    manage hypertension, congestion, tooth concerns at home [place])       Interventions Today    Flowsheet Row Most Recent Value  Chronic Disease   Chronic disease during today's visit Other  [Congestion, MDs, tooth abscess, sagging skin]  General Interventions   General Interventions Discussed/Reviewed General Interventions Reviewed  Doctor Visits Discussed/Reviewed Doctor Visits Reviewed, PCP  PCP/Specialist Visits Compliance with follow-up visit  Exercise Interventions   Exercise Discussed/Reviewed Exercise Reviewed, Physical Activity  Physical Activity Discussed/Reviewed Physical Activity Reviewed, Types of exercise  [walking, chair exercises, activity during 5 minute tv commercial breaks discussed]  Education Interventions   Education Provided Provided Web-based Education, Provided Education  Provided Verbal Education On Other  [congestion, tooth abscess, skin care info sent]  Mental Health Interventions   Mental Health Discussed/Reviewed Mental Health Reviewed, Coping Strategies  Nutrition Interventions   Nutrition Discussed/Reviewed Nutrition Reviewed, Fluid intake  Pharmacy Interventions   Pharmacy Dicussed/Reviewed Pharmacy Topics Reviewed, Affording Medications  Safety Interventions   Safety Discussed/Reviewed Safety Discussed  [encourged safety]              Our next appointment is by telephone on 12/25/22 at 1:15 pm  Please call the care guide team at [phone] if you need to cancel or reschedule your appointment.   If you  are experiencing a Mental Health or Behavioral Health Crisis or need someone to talk to, please call the Suicide and Crisis Lifeline: 988 call the [place] National Suicide Prevention Lifeline: 7255149099 or TTY: 530-540-3963 TTY 802-146-1779) to talk to a trained counselor call 1-800-273-TALK (toll free, 24 hour hotline) call the [facility]: (845)500-1679 call 911   Patient verbalizes understanding of instructions and care plan provided today and agrees to view in MyChart. Active MyChart status and patient understanding of how to access instructions and care plan via MyChart confirmed with patient.     The patient has been provided with contact information for the care management team and has been advised to call with any health related questions or concerns.   Kenechukwu Eckstein L. Noelle Penner, RN, BSN, CCM, Care Management Coordinator [phone]

## 2022-10-01 ENCOUNTER — Ambulatory Visit: Payer: Medicare Other | Admitting: Family Medicine

## 2022-10-14 ENCOUNTER — Ambulatory Visit (INDEPENDENT_AMBULATORY_CARE_PROVIDER_SITE_OTHER): Payer: Medicare Other | Admitting: Family Medicine

## 2022-10-14 ENCOUNTER — Encounter: Payer: Self-pay | Admitting: Family Medicine

## 2022-10-14 VITALS — BP 120/80 | HR 64 | Temp 97.8°F | Ht 74.0 in | Wt 273.8 lb

## 2022-10-14 DIAGNOSIS — I10 Essential (primary) hypertension: Secondary | ICD-10-CM

## 2022-10-14 DIAGNOSIS — Z23 Encounter for immunization: Secondary | ICD-10-CM | POA: Diagnosis not present

## 2022-10-14 DIAGNOSIS — R739 Hyperglycemia, unspecified: Secondary | ICD-10-CM

## 2022-10-14 DIAGNOSIS — F419 Anxiety disorder, unspecified: Secondary | ICD-10-CM

## 2022-10-14 LAB — BAYER DCA HB A1C WAIVED: HB A1C (BAYER DCA - WAIVED): 5 % (ref 4.8–5.6)

## 2022-10-14 MED ORDER — PAROXETINE HCL 10 MG PO TABS: 10.0000 mg | ORAL_TABLET | Freq: Every day | ORAL | 3 refills | Status: DC

## 2022-10-14 MED ORDER — LISINOPRIL-HYDROCHLOROTHIAZIDE 20-12.5 MG PO TABS: 1.0000 | ORAL_TABLET | Freq: Every day | ORAL | 3 refills | Status: DC

## 2022-10-14 MED ORDER — TRAZODONE HCL 50 MG PO TABS: 25.0000 mg | ORAL_TABLET | Freq: Every evening | ORAL | 3 refills | Status: DC | PRN

## 2022-10-14 NOTE — Progress Notes (Signed)
BP 120/80   Pulse 64   Temp 97.8 F (36.6 C) (Temporal)   Ht 6\' 2"  (1.88 m)   Wt 273 lb 12.8 oz (124.2 kg)   SpO2 94%   BMI 35.15 kg/m    Subjective:   Patient ID: Jeremy Bennett, male    DOB: 07-13-1955, 67 y.o.   MRN: [id number]  HPI: Jeremy Bennett is a 67 y.o. male presenting on 10/14/2022 for Medical Management of Chronic Issues (6 month)   HPI Hypertension Patient is currently on lisinopril hydrochlorothiazide, and their blood pressure today is 120/80. Patient denies any lightheadedness or dizziness. Patient denies headaches, blurred vision, chest pains, shortness of breath, or weakness. Denies any side effects from medication and is content with current medication.   Patient had elevated blood sugar on the last couple times fasting, will check an A1c with today's.  Anxiety and insomnia recheck Patient is coming in today for anxiety and insomnia recheck.  Currently he uses Paxil and trazodone.  He feels like he is doing pretty well and he feels like it is helping him sleep sufficiently.  He denies any major issues.  Relevant past medical, surgical, family and social history reviewed and updated as indicated. Interim medical history since our last visit reviewed. Allergies and medications reviewed and updated.  Review of Systems  Constitutional:  Negative for chills and fever.  Eyes:  Negative for visual disturbance.  Respiratory:  Negative for shortness of breath and wheezing.   Cardiovascular:  Negative for chest pain and leg swelling.  Musculoskeletal:  Negative for back pain and gait problem.  Skin:  Negative for rash.  Neurological:  Negative for dizziness, weakness and light-headedness.  All other systems reviewed and are negative.   Per HPI unless specifically indicated above   Allergies as of 10/14/2022       Reactions   No Known Allergies         Medication List        Accurate as of October 14, 2022  2:52 PM. If you have any questions, ask your  nurse or doctor.          colchicine 0.6 MG tablet For acute attack of gout use one at onset, then one every two hours until 6 pills have been taken. Stop sooner if pain is relieved or if nausea occurs. Then revert to BID   lisinopril-hydrochlorothiazide 20-12.5 MG tablet Commonly known as: ZESTORETIC Take 1 tablet by mouth daily.   meclizine 12.5 MG tablet Commonly known as: ANTIVERT Take 1-2 tablets (12.5-25 mg total) by mouth 3 (three) times daily as needed for dizziness.   multivitamin tablet Take 1 tablet by mouth daily.   PARoxetine 10 MG tablet Commonly known as: PAXIL Take 1 tablet (10 mg total) by mouth daily.   Systane 0.4-0.3 % Soln Generic drug: Polyethyl Glycol-Propyl Glycol Place 1 drop into both eyes daily as needed (Dry eyes).   traZODone 50 MG tablet Commonly known as: DESYREL Take 0.5-1 tablets (25-50 mg total) by mouth at bedtime as needed. for sleep   triamcinolone cream 0.1 % Commonly known as: KENALOG APPLY TO AFFECTED AREA TWICE A DAY         Objective:   BP 120/80   Pulse 64   Temp 97.8 F (36.6 C) (Temporal)   Ht 6\' 2"  (1.88 m)   Wt 273 lb 12.8 oz (124.2 kg)   SpO2 94%   BMI 35.15 kg/m   Wt Readings from Last 3 Encounters:  10/14/22 273 lb 12.8 oz (124.2 kg)  09/22/22 270 lb (122.5 kg)  03/23/22 273 lb (123.8 kg)    Physical Exam Vitals and nursing note reviewed.  Constitutional:      General: He is not in acute distress.    Appearance: He is well-developed. He is not diaphoretic.  Eyes:     General: No scleral icterus.       Right eye: No discharge.     Conjunctiva/sclera: Conjunctivae normal.     Pupils: Pupils are equal, round, and reactive to light.  Neck:     Thyroid: No thyromegaly.  Cardiovascular:     Rate and Rhythm: Normal rate and regular rhythm.     Heart sounds: Normal heart sounds. No murmur heard. Pulmonary:     Effort: Pulmonary effort is normal. No respiratory distress.     Breath sounds: Normal breath  sounds. No wheezing.  Musculoskeletal:        General: Normal range of motion.     Cervical back: Neck supple.  Lymphadenopathy:     Cervical: No cervical adenopathy.  Skin:    General: Skin is warm and dry.     Findings: No rash.  Neurological:     Mental Status: He is alert and oriented to person, place, and time.     Coordination: Coordination normal.  Psychiatric:        Behavior: Behavior normal.       Assessment & Plan:   Problem List Items Addressed This Visit       Cardiovascular and Mediastinum   Hypertension - Primary   Relevant Medications   lisinopril-hydrochlorothiazide (ZESTORETIC) 20-12.5 MG tablet   Other Relevant Orders   CBC with Differential/Platelet   CMP14+EGFR   Lipid panel     Other   Anxiety   Relevant Medications   PARoxetine (PAXIL) 10 MG tablet   traZODone (DESYREL) 50 MG tablet   Elevated blood sugar   Relevant Orders   Bayer DCA Hb A1c Waived   Other Visit Diagnoses     Encounter for immunization       Relevant Orders   Flu Vaccine Trivalent High Dose (Fluad) (Completed)   Essential hypertension       Relevant Medications   lisinopril-hydrochlorothiazide (ZESTORETIC) 20-12.5 MG tablet       Will check blood work and A1c and see how everything looks.  Blood pressure looks great today, no medication changes, seems to be doing well. Follow up plan: Return in about 6 months (around 04/13/2023), or if symptoms worsen or fail to improve, for Physical exam and hypertension and anxiety recheck.  Counseling provided for all of the vaccine components Orders Placed This Encounter  Procedures   Flu Vaccine Trivalent High Dose (Fluad)   CBC with Differential/Platelet   CMP14+EGFR   Lipid panel   Bayer DCA Hb A1c Waived    Arville Care, MD [facility] 10/14/2022, 2:52 PM

## 2022-10-15 LAB — CBC WITH DIFFERENTIAL/PLATELET
Basophils Absolute: 0 10*3/uL (ref 0.0–0.2)
Basos: 1 %
EOS (ABSOLUTE): 0.2 10*3/uL (ref 0.0–0.4)
Eos: 3 %
Hematocrit: 48.7 % (ref 37.5–51.0)
Hemoglobin: 16.5 g/dL (ref 13.0–17.7)
Immature Grans (Abs): 0 10*3/uL (ref 0.0–0.1)
Immature Granulocytes: 0 %
Lymphocytes Absolute: 1.7 10*3/uL (ref 0.7–3.1)
Lymphs: 31 %
MCH: 32.1 pg (ref 26.6–33.0)
MCHC: 33.9 g/dL (ref 31.5–35.7)
MCV: 95 fL (ref 79–97)
Monocytes Absolute: 0.3 10*3/uL (ref 0.1–0.9)
Monocytes: 6 %
Neutrophils Absolute: 3.4 10*3/uL (ref 1.4–7.0)
Neutrophils: 59 %
Platelets: 145 10*3/uL — ABNORMAL LOW (ref 150–450)
RBC: 5.14 x10E6/uL (ref 4.14–5.80)
RDW: 13.1 % (ref 11.6–15.4)
WBC: 5.6 10*3/uL (ref 3.4–10.8)

## 2022-10-15 LAB — CMP14+EGFR
ALT: 28 IU/L (ref 0–44)
AST: 26 IU/L (ref 0–40)
Albumin: 4.6 g/dL (ref 3.9–4.9)
Alkaline Phosphatase: 63 IU/L (ref 44–121)
BUN/Creatinine Ratio: 12 (ref 10–24)
BUN: 11 mg/dL (ref 8–27)
Bilirubin Total: 2.8 mg/dL — ABNORMAL HIGH (ref 0.0–1.2)
CO2: 24 mmol/L (ref 20–29)
Calcium: 9.8 mg/dL (ref 8.6–10.2)
Chloride: 104 mmol/L (ref 96–106)
Creatinine, Ser: 0.93 mg/dL (ref 0.76–1.27)
Globulin, Total: 2.2 g/dL (ref 1.5–4.5)
Glucose: 98 mg/dL (ref 70–99)
Potassium: 4.1 mmol/L (ref 3.5–5.2)
Sodium: 142 mmol/L (ref 134–144)
Total Protein: 6.8 g/dL (ref 6.0–8.5)
eGFR: 91 mL/min/{1.73_m2} (ref >59)

## 2022-10-15 LAB — LIPID PANEL
Chol/HDL Ratio: 5.2 ratio — ABNORMAL HIGH (ref 0.0–5.0)
Cholesterol, Total: 197 mg/dL (ref 100–199)
HDL: 38 mg/dL — ABNORMAL LOW (ref >39)
LDL Chol Calc (NIH): 122 mg/dL — ABNORMAL HIGH (ref 0–99)
Triglycerides: 210 mg/dL — ABNORMAL HIGH (ref 0–149)
VLDL Cholesterol Cal: 37 mg/dL (ref 5–40)

## 2022-12-25 ENCOUNTER — Ambulatory Visit: Payer: Self-pay | Admitting: *Deleted

## 2022-12-25 NOTE — Patient Instructions (Addendum)
Visit Information  Thank you for taking time to visit with me today. Please don't hesitate to contact me if I can be of assistance to you.   Following are the goals we discussed today:   Goals Addressed             This Visit's Progress    manage hypertension, congestion, tooth concerns at home [facility])   On track    Patient will monitor, decrease fatty foods to help improve lipid panel values 11/2922 Patient reports improvement with congestion, teeth and maintained blood pressure - Goal Met/completed   Interventions Today    Flowsheet Row Most Recent Value  Chronic Disease   Chronic disease during today's visit Hypertension (HTN), Diabetes, Other  [hyperlidemia]  General Interventions   General Interventions Discussed/Reviewed General Interventions Reviewed, Doctor Visits, Lipid Profile  Doctor Visits Discussed/Reviewed PCP, Doctor Visits Reviewed  PCP/Specialist Visits Compliance with follow-up visit  Exercise Interventions   Exercise Discussed/Reviewed Exercise Reviewed, Physical Activity  [encouraged]  Physical Activity Discussed/Reviewed Physical Activity Reviewed  Education Interventions   Education Provided Provided Web-based Education, Provided Education  [cholesterol, calcification, [employer] annual home visits,  customer service, bilirubin, LDL, HDL, triglycerides,]  Provided Verbal Education On Nutrition, Labs, Mental Health/Coping with Illness, Medication, Sick Day Rules, [employer], Development worker, community, Other  [vertigo]  Mental Health Interventions   Mental Health Discussed/Reviewed Mental Health Reviewed, Coping Strategies  Nutrition Interventions   Nutrition Discussed/Reviewed Nutrition Reviewed, Decreasing fats  Pharmacy Interventions   Pharmacy Dicussed/Reviewed Pharmacy Topics Reviewed, Medications and their functions, Affording Medications              Our next appointment is by telephone on 02/05/23 at 1 pm  Please call the care guide team at  [phone] if you need to cancel or reschedule your appointment.   If you are experiencing a Mental Health or Behavioral Health Crisis or need someone to talk to, please call the Suicide and Crisis Lifeline: 988 call the [place] National Suicide Prevention Lifeline: 252-327-3581 or TTY: 701-785-5145 TTY 713 660 4082) to talk to a trained counselor call 1-800-273-TALK (toll free, 24 hour hotline) call the [facility]: 8055302245 call 911   Patient verbalizes understanding of instructions and care plan provided today and agrees to view in MyChart. Active MyChart status and patient understanding of how to access instructions and care plan via MyChart confirmed with patient.     The patient has been provided with contact information for the care management team and has been advised to call with any health related questions or concerns.   Uriah Philipson L. Noelle Penner, RN, BSN, [facility]  VBCI Care Management Coordinator  [phone]  Fax: [phone]

## 2022-12-25 NOTE — Patient Outreach (Signed)
  Care Coordination   Follow Up Visit Note   12/25/2022 Name: Jeremy Bennett MRN: [id number] DOB: 1955/10/23  Jeremy Bennett is a 67 y.o. year old male who sees Dettinger, Elige Radon, MD for primary care. I spoke with  Jeremy Bennett by phone today.  What matters to the patients health and wellness today?  Hyperlipidemia, diabetes, [employer] annual home/virtual visit, customer services  Denies any worsening symptoms  Diabetes 2 - (/18/24 HgA1c 5 Excellent   Hyperlipidemia- triglycerides down from 238 to 210, HDL down was 42 now 38, LDL up from 106 to 122, Chol/HDL ration up was 4.5 now 5.2 bilirubin is a little bit on the higher side at 2.8 , normal range between 1 and 2, will make sure he is hydrating well    Next pcp visit should be in 6 months around March-April 2025  Will complete united healthcare's annual visit telephonically& obtain his $75 incentive Vertigo  noted when he bends or hold his neck constantly in one position   Voiced understanding of answers related to customer services and annual home visits    Goals Addressed             This Visit's Progress    manage hypertension, congestion, tooth concerns at home [facility])   On track    Interventions Today    Flowsheet Row Most Recent Value  Chronic Disease   Chronic disease during today's visit Hypertension (HTN), Diabetes, Other  [hyperlidemia]  General Interventions   General Interventions Discussed/Reviewed General Interventions Reviewed, Doctor Visits, Lipid Profile  Doctor Visits Discussed/Reviewed PCP, Doctor Visits Reviewed  PCP/Specialist Visits Compliance with follow-up visit  Exercise Interventions   Exercise Discussed/Reviewed Exercise Reviewed, Physical Activity  Physical Activity Discussed/Reviewed Physical Activity Reviewed  Education Interventions   Education Provided Provided Education, Provided Web-based Education  [cholesterol, calcification, [employer] annual home visits,]  Mental Health Interventions    Mental Health Discussed/Reviewed Mental Health Reviewed, Coping Strategies  Nutrition Interventions   Nutrition Discussed/Reviewed Nutrition Reviewed, Decreasing fats  Pharmacy Interventions   Pharmacy Dicussed/Reviewed Pharmacy Topics Reviewed, Medications and their functions, Affording Medications              SDOH assessments and interventions completed:  No     Care Coordination Interventions:  Yes, provided   Follow up plan: Follow up call scheduled for 02/05/23    Encounter Outcome:  Patient Visit Completed   Cala Bradford L. Noelle Penner, RN, BSN, [facility]  VBCI Care Management Coordinator  [phone]  Fax: [phone]

## 2023-01-11 ENCOUNTER — Telehealth: Payer: Self-pay | Admitting: *Deleted

## 2023-01-11 NOTE — Telephone Encounter (Signed)
Error

## 2023-02-05 ENCOUNTER — Ambulatory Visit: Payer: Self-pay | Admitting: *Deleted

## 2023-02-05 NOTE — Patient Outreach (Addendum)
  Care Coordination   Follow Up Visit Note   02/06/2023 Name: Jeremy Bennett MRN: [id number] DOB: Jan 23, 1956  Jeremy Bennett is a 67 y.o. year old male who sees Dettinger, Fonda LABOR, MD for primary care. I spoke with  Jeremy Bennett by phone today.  What matters to the patients health and wellness today?  Doing well with managing hypertension, history of elevated glucose but now managed HgA1c 5, pain in knees at intervals. He is concentrating on trying to manage his weight in 2025    Last lipid labs in 09/2022 pending further labs to check on improvement  Weight management He informs RN CM he knows what to do the trouble is to start and continue to do it. Hehas a steps tracker and is connected with united [employer] care medicare weight management 5000 step program His present BMI is 35.3 at 6 '2  & a weight of 275  lbs today  His personal goal is 230 lbs with a goal of a BMI of 29.3  Aware that to get to a BMI of 25  he will have to be at 195 lbs He confirms this was the weight he was at during his high school days.   He doesn't have a preference to return to this weight He voiced understanding of [facility] weight [facility] wellness program and Nutritionists/dietitian services  Agrees to 6 month follow ups and outreach to  RN CM as needed    Goals Addressed             This Visit's Progress    manage hyperlipidemia, weight management at home (VBCI care coordinator services)   On track     02/05/23 he confirms maintenance of HgA1c as 5- Diabetes goal met His goal in 2025 is for weight loss to 230 lbs  Patient will monitor, decrease fatty foods to help improve lipid panel values  Last lipid labs in 09/2022 pending further labs to check on improvement  11/2922 Patient reports improvement with congestion, teeth and maintained blood pressure - Goal Met/completed   Interventions Today    Flowsheet Row Most Recent Value  Chronic Disease   Chronic disease during today's visit Hypertension  (HTN), Diabetes, Other  [hyperlidemia]  General Interventions   General Interventions Discussed/Reviewed General Interventions Reviewed, Doctor Visits, Lipid Profile  Doctor Visits Discussed/Reviewed PCP, Doctor Visits Reviewed  PCP/Specialist Visits Compliance with follow-up visit  Exercise Interventions   Exercise Discussed/Reviewed Exercise Reviewed, Physical Activity  [encouraged]  Physical Activity Discussed/Reviewed Physical Activity Reviewed  Education Interventions   Education Provided Provided Web-based Education, Provided Education  [cholesterol, calcification, UHC annual home visits,  customer service, bilirubin, LDL, HDL, triglycerides,]  Provided Verbal Education On Nutrition, Labs, Mental Health/Coping with Illness, Medication, Sick Day Rules, [employer], Development Worker, Community, Other  [vertigo]  Mental Health Interventions   Mental Health Discussed/Reviewed Mental Health Reviewed, Coping Strategies  Nutrition Interventions   Nutrition Discussed/Reviewed Nutrition Reviewed, Decreasing fats  Pharmacy Interventions   Pharmacy Dicussed/Reviewed Pharmacy Topics Reviewed, Medications and their functions, Affording Medications              SDOH assessments and interventions completed:  No     Care Coordination Interventions:  Yes, provided   Follow up plan: Follow up call scheduled for 08/05/23 1 pm     Encounter Outcome:  Patient Visit Completed   Jeremy Raucci L. Ramonita, RN, BSN, [facility]  VBCI Care Management Coordinator  [phone]  Fax: [phone]

## 2023-02-06 NOTE — Patient Instructions (Addendum)
 Visit Information  Thank you for taking time to visit with me today. Please don't hesitate to contact me if I can be of assistance to you.   Following are the goals we discussed today:   Goals Addressed             This Visit's Progress    manage hyperlipidemia, weight management at home (VBCI care coordinator services)   On track     02/05/23 he confirms maintenance of HgA1c as 5- Diabetes goal met His goal in 2025 is for weight loss to 230 lbs  Patient will monitor, decrease fatty foods to help improve lipid panel values  Last lipid labs in 09/2022 pending further labs to check on improvement  11/2922 Patient reports improvement with congestion, teeth and maintained blood pressure - Goal Met/completed   Interventions Today    Flowsheet Row Most Recent Value  Chronic Disease   Chronic disease during today's visit Hypertension (HTN), Diabetes, Other  [hyperlidemia]  General Interventions   General Interventions Discussed/Reviewed General Interventions Reviewed, Doctor Visits, Lipid Profile  Doctor Visits Discussed/Reviewed PCP, Doctor Visits Reviewed  PCP/Specialist Visits Compliance with follow-up visit  Exercise Interventions   Exercise Discussed/Reviewed Exercise Reviewed, Physical Activity  [encouraged]  Physical Activity Discussed/Reviewed Physical Activity Reviewed  Education Interventions   Education Provided Provided Web-based Education, Provided Education  [cholesterol, calcification, U[employer] annual home visits,  customer service, bilirubin, LDL, HDL, triglycerides,]  Provided Verbal Education On Nutrition, Labs, Mental Health/Coping with Illness, Medication, Sick Day Rules, [employer], Development Worker, Community, Other  [vertigo]  Mental Health Interventions   Mental Health Discussed/Reviewed Mental Health Reviewed, Coping Strategies  Nutrition Interventions   Nutrition Discussed/Reviewed Nutrition Reviewed, Decreasing fats  Pharmacy Interventions   Pharmacy  Dicussed/Reviewed Pharmacy Topics Reviewed, Medications and their functions, Affording Medications              Our next appointment is by telephone on 08/05/23 at 1 pm  Please call the care guide team at [phone] if you need to cancel or reschedule your appointment.   If you are experiencing a Mental Health or Behavioral Health Crisis or need someone to talk to, please call the Suicide and Crisis Lifeline: 988 call the [place]  National Suicide Prevention Lifeline: (506) 815-7214 or TTY: 360 742 7576 TTY 540-573-3878) to talk to a trained counselor call 1-800-273-TALK (toll free, 24 hour hotline) call the [facility]: 405-538-5772 call 911   Patient verbalizes understanding of instructions and care plan provided today and agrees to view in MyChart. Active MyChart status and patient understanding of how to access instructions and care plan via MyChart confirmed with patient.     The patient has been provided with contact information for the care management team and has been advised to call with any health related questions or concerns.   Lamanda Rudder L. Ramonita, RN, BSN, CCM  calorie, counting for  [facility] Coordinator  [phone]  Fax: [phone]

## 2023-08-05 ENCOUNTER — Other Ambulatory Visit: Payer: Self-pay

## 2023-08-05 ENCOUNTER — Ambulatory Visit: Payer: Medicare Other | Admitting: *Deleted

## 2023-08-05 ENCOUNTER — Encounter: Payer: Self-pay | Admitting: *Deleted

## 2023-08-05 NOTE — Patient Outreach (Signed)
 Complex Care Management   Visit Note  08/05/2023  Name:  Jeremy Bennett MRN: [id number] DOB: [birth year]  Situation: Referral received for Complex Care Management related to transfer of patient from Jeremy Bennett, previous RN CCM I obtained verbal consent from Patient.  Visit completed with Jeremy Bennett  on the phone  Outreach for his 6 month follow up He reports he is doing well He was wished a pre happy birthday  Only concerned with cholesterol & blood cell count Diagnosis with hyperlipidemia after a free [employer] employee body scan indicated he was at level 1. His 10/14/22 labs indicated triglycerides at 210, HDL at 38 LDL at 122 Chol/HDL ratio at 5.2 His CMP14 values were ok except the Bilirubin was up at 2.8 His pcp reviewed & commented -Hemoglobin and white count look good.  Platelets look stable.  Kidney function and liver function look good.  His bilirubin is a little bit on the higher side at 2.8 but he has ranged between 1 and 2, just make sure he is hydrating well and will monitor in the future.  His cholesterol numbers are showing that the LDL is slightly more elevated.  Focus on diet.  A1c looks good at 5.0  Patient was called to review this by pcp staff on 10/21/22   Continues to work on his diet & exercises as he does not prefer to be started on statins Wt 259 lb  He states his exercise include activities like on last month he put flooring down, walked  Need annual physical scheduled, He will call to schedule. RN CCM will mention this to pcp staff   Agrees to not be scheduled with new pcp RN CCM until a discussion is had with new VBCI RN CCM & pcp  Background:   Past Medical History:  Diagnosis Date   ADD (attention deficit disorder)    Arthritis    Gout    Gout attack    History of asbestosis    Hypertension    Sleep apnea    tested more than 5 yrs ago    Assessment: Patient Reported Symptoms:  Cognitive Cognitive Status: Alert and oriented to person, place,  and time, Normal speech and language skills, Insightful and able to interpret abstract concepts   Healing Pattern: Average Health Facilitated by: Rest  Neurological Neurological Review of Symptoms: No symptoms reported Neurological Management Strategies: Routine screening, Adequate rest Neurological Self-Management Outcome: 4 (good)  HEENT HEENT Symptoms Reported: No symptoms reported HEENT Self-Management Outcome: 4 (good)    Cardiovascular   Cardiovascular Management Strategies: Routine screening, Diet modification, Fluid modification Weight: 259 lb (117.5 kg) Cardiovascular Self-Management Outcome: 3 (uncertain)  Respiratory Respiratory Symptoms Reported: No symptoms reported Respiratory Management Strategies: Routine screening Respiratory Self-Management Outcome: 4 (good)  Endocrine Endocrine Symptoms Reported: No symptoms reported Endocrine Self-Management Outcome: 4 (good)  Gastrointestinal Gastrointestinal Symptoms Reported: No symptoms reported Gastrointestinal Self-Management Outcome: 4 (good)    Genitourinary Genitourinary Symptoms Reported: No symptoms reported Genitourinary Self-Management Outcome: 4 (good)  Integumentary Integumentary Symptoms Reported: No symptoms reported Skin Management Strategies: Routine screening Skin Self-Management Outcome: 4 (good)  Musculoskeletal Musculoskelatal Symptoms Reviewed: Joint pain Musculoskeletal Management Strategies: Medication therapy, Routine screening Musculoskeletal Self-Management Outcome: 4 (good) Falls in the past year?: No Number of falls in past year: 1 or less Was there an injury with Fall?: No Fall Risk Category Calculator: 0 Patient Fall Risk Level: Low Fall Risk Fall risk Follow up: Falls evaluation completed  Psychosocial Psychosocial Symptoms Reported: No  symptoms reported Behavioral Management Strategies: Support system, Adequate rest Behavioral Health Self-Management Outcome: 4 (good) Major  Change/Loss/Stressor/Fears (CP): Denies Techniques to Cope with Loss/Stress/Change: Diversional activities, Spiritual practice(s) Quality of Family Relationships: helpful, involved, supportive Do you feel physically threatened by others?: No      08/05/2023    1:45 PM  Depression screen PHQ 2/9  Decreased Interest 0  Down, Depressed, Hopeless 0  PHQ - 2 Score 0    There were no vitals filed for this visit.  Medications Reviewed Today   Medications were not reviewed in this encounter     Recommendation:   PCP Follow-up Continue Current Plan of Care  Follow Up Plan:   Jeremy Bennett will be discussed with his pcp and new VBCI RN CCM in the office to see if further VBCI services are needed + get staff to call to get him scheduled for his annual visit   Jeremy Bennett L. Ramonita, RN, BSN, CCM [place]  [facility], [facility] RN Care Manager Direct Dial: [phone]  Fax: [phone]

## 2023-08-05 NOTE — Patient Instructions (Addendum)
 Visit Information  Thank you for taking time to visit with me today. Please don't hesitate to contact me if I can be of assistance to you before our next scheduled appointment.  Your next care management appointment is pending on ? at ?  The new Case manager in [place] office is Rosina Forte [phone]  Please call the care guide team at [phone] if you need to cancel, schedule, or reschedule an appointment.   Please call the Suicide and Crisis Lifeline: 988 call the [place]  National Suicide Prevention Lifeline: 340-054-1119 or TTY: (307) 627-3652 TTY (530)148-6846) to talk to a trained counselor call 1-800-273-TALK (toll free, 24 hour hotline) call the [facility]: (434)103-8246 call 911 if you are experiencing a Mental Health or Behavioral Health Crisis or need someone to talk to.  Karista Aispuro L. Ramonita, RN, BSN, CCM [place]  [facility], [facility] RN Care Manager Direct Dial: [phone]  Fax: [phone]

## 2023-08-09 ENCOUNTER — Telehealth: Payer: Self-pay

## 2023-08-09 NOTE — Telephone Encounter (Signed)
-----   Message from Nurse Suzen MATSU sent at 08/09/2023 10:31 AM EDT ----- Regarding: increased lab values for cholesterol & blood count Completed a 6 month follow up with this patient. He is concern with elevated lipid & blood counts. His numbers in September 2024 were elevated & he had a body scan through his job that indicated elevations. He reports exercising and diet changes.   Need pcp staff to schedule him for an annual physical  Shatara Stanek,RN CCM,  I am removing myself from the care team. Check with pcp to see if he still wants him followed.  Kimberly L. Ramonita, RN, BSN, CCM [place]  [facility], [facility] RN Care Manager Direct Dial: [phone]  Fax: [phone]

## 2023-08-09 NOTE — Telephone Encounter (Signed)
 Appt made for Aug 20th at 1:25pm. Pt made aware.

## 2023-09-15 ENCOUNTER — Ambulatory Visit: Admitting: Family Medicine

## 2023-09-15 ENCOUNTER — Encounter: Payer: Self-pay | Admitting: Family Medicine

## 2023-09-15 VITALS — BP 107/71 | HR 66 | Temp 97.8°F | Ht 74.0 in | Wt 251.0 lb

## 2023-09-15 DIAGNOSIS — Z0000 Encounter for general adult medical examination without abnormal findings: Secondary | ICD-10-CM

## 2023-09-15 DIAGNOSIS — Z125 Encounter for screening for malignant neoplasm of prostate: Secondary | ICD-10-CM | POA: Diagnosis not present

## 2023-09-15 DIAGNOSIS — Z0001 Encounter for general adult medical examination with abnormal findings: Secondary | ICD-10-CM | POA: Diagnosis not present

## 2023-09-15 DIAGNOSIS — I10 Essential (primary) hypertension: Secondary | ICD-10-CM | POA: Diagnosis not present

## 2023-09-15 LAB — LIPID PANEL

## 2023-09-15 MED ORDER — LISINOPRIL-HYDROCHLOROTHIAZIDE 20-12.5 MG PO TABS
1.0000 | ORAL_TABLET | Freq: Every day | ORAL | 3 refills | Status: AC
Start: 2023-09-15 — End: ?

## 2023-09-15 NOTE — Progress Notes (Signed)
 BP 107/71   Pulse 66   Temp 97.8 F (36.6 C)   Ht 6' 2 (1.88 m)   Wt 251 lb (113.9 kg)   SpO2 96%   BMI 32.23 kg/m    Subjective:   Patient ID: Jeremy Bennett Ill, male    DOB: 1955-09-14, 68 y.o.   MRN: [id number]  HPI: TORRE SCHAUMBURG is a 68 y.o. male presenting on 09/15/2023 for Medical Management of Chronic Issues (6 month follow up/(Closer to 1 year f/u))   Discussed the use of AI scribe software for clinical note transcription with the patient, who gave verbal consent to proceed.  History of Present Illness   Jeremy Bennett is a 68 year old male who presents for an annual physical exam.  He experiences ongoing sleep disturbances, achieving good sleep every two to three nights. On other nights, he has difficulty both falling and staying asleep. He has a history of sleep apnea, but a sleep study conducted a couple of years ago showed no apnea, although snoring was present. He no longer uses his CPAP machine. He has tried various sleep aids, including trazodone , which he stopped due to heart discomfort, and melatonin. He occasionally uses over-the-counter sleep aids like Tylenol  PM and Sleep Aid but avoids regular use to prevent tolerance.  He is currently off Paxil , which he had been taking previously, and reports feeling better since discontinuing it. He continues to take lisinopril  for blood pressure management and reports no issues with blood pressure, although he occasionally experiences lightheadedness when standing up too quickly.  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 He is due for a body scan related to this condition but has not yet scheduled it.  No urinary issues, such as trouble starting his stream. He is due for a colonoscopy, which is expected to occur in October.          Relevant past medical, surgical, family and social history reviewed and updated as indicated. Interim medical history since our last  visit reviewed. Allergies and medications reviewed and updated.  Review of Systems  Constitutional:  Negative for chills and fever.  HENT:  Negative for ear pain and tinnitus.   Eyes:  Negative for pain and discharge.  Respiratory:  Negative for cough, shortness of breath and wheezing.   Cardiovascular:  Negative for chest pain, palpitations and leg swelling.  Gastrointestinal:  Negative for abdominal pain, blood in stool, constipation and diarrhea.  Genitourinary:  Negative for dysuria and hematuria.  Musculoskeletal:  Negative for back pain, gait problem and myalgias.  Skin:  Negative for rash.  Neurological:  Negative for dizziness, weakness and headaches.  Psychiatric/Behavioral:  Positive for sleep disturbance. Negative for dysphoric mood and suicidal ideas. The patient is not nervous/anxious.   All other systems reviewed and are negative.   Per HPI unless specifically indicated above   Allergies as of 09/15/2023       Reactions   No Known Allergies         Medication List        Accurate as of September 15, 2023  1:42 PM. If you have any questions, ask your nurse or doctor.          STOP taking these medications    colchicine  0.6 MG tablet Stopped by: Fonda LABOR Abriel Hattery   meclizine  12.5 MG tablet Commonly known as: ANTIVERT  Stopped by: Fonda LABOR Edras Wilford   PARoxetine  10 MG tablet Commonly  known as: PAXIL  Stopped by: Fonda LABOR Totiana Everson   traZODone  50 MG tablet Commonly known as: DESYREL  Stopped by: Fonda LABOR Onedia Vargus       TAKE these medications    lisinopril -hydrochlorothiazide  20-12.5 MG tablet Commonly known as: ZESTORETIC  Take 1 tablet by mouth daily.   multivitamin tablet Take 1 tablet by mouth daily.   Systane 0.4-0.3 % Soln Generic drug: Polyethyl Glycol-Propyl Glycol Place 1 drop into both eyes daily as needed (Dry eyes).   triamcinolone  cream 0.1 % Commonly known as: KENALOG  APPLY TO AFFECTED AREA TWICE A DAY          Objective:   BP 107/71   Pulse 66   Temp 97.8 F (36.6 C)   Ht 6' 2 (1.88 m)   Wt 251 lb (113.9 kg)   SpO2 96%   BMI 32.23 kg/m   Wt Readings from Last 3 Encounters:  09/15/23 251 lb (113.9 kg)  08/05/23 259 lb (117.5 kg)  10/14/22 273 lb 12.8 oz (124.2 kg)    Physical Exam Physical Exam   VITALS: BP- 107/71 HEENT: Ears without cerumen. Throat normal. NECK: Thyroid without masses. CHEST: Lungs clear to auscultation. CARDIOVASCULAR: Regular heart sounds, no murmurs. ABDOMEN: Abdomen non-tender. EXTREMITIES: No edema in legs, good pulses.         Assessment & Plan:   Problem List Items Addressed This Visit       Cardiovascular and Mediastinum   Hypertension - Primary   Relevant Medications   lisinopril -hydrochlorothiazide  (ZESTORETIC ) 20-12.5 MG tablet   Other Relevant Orders   CBC with Differential/Platelet   CMP14+EGFR   Lipid panel   Other Visit Diagnoses       Essential hypertension       Relevant Medications   lisinopril -hydrochlorothiazide  (ZESTORETIC ) 20-12.5 MG tablet   Other Relevant Orders   CBC with Differential/Platelet   CMP14+EGFR   Lipid panel     Physical exam       Relevant Orders   PSA, total and free     Prostate cancer screening       Relevant Orders   PSA, total and free         Adult Wellness Visit Routine wellness exam showed no significant abnormalities. Blood pressure controlled at 107/71 mmHg. Patient prefers blood test (PSA) for prostate check. - Order blood work including PSA, kidney function, liver function, blood sugar, and cholesterol.  Insomnia Chronic insomnia with difficulty initiating and maintaining sleep. Previously used trazodone , now using melatonin and OTC sleep aids. History of sleep apnea, not using CPAP, declined Inspire device. - Continue current regimen of melatonin and over-the-counter sleep aids as needed.  Essential hypertension Hypertension well-controlled with lisinopril . Occasional  dizziness when standing quickly. - Continue lisinopril . - Advise to stay hydrated and monitor for increased frequency of dizziness.  History of asbestosis Previous diagnosis of asbestosis with compensation. Annual body scan due, not yet completed this year. - Ensure body scan for asbestosis is completed and request a copy of the results.          Follow up plan: Return in about 1 year (around 09/14/2024), or if symptoms worsen or fail to improve, for Physical exam.  Counseling provided for all of the vaccine components Orders Placed This Encounter  Procedures   CBC with Differential/Platelet   CMP14+EGFR   Lipid panel   PSA, total and free    Fonda Levins, MD [facility] 09/15/2023, 1:42 PM

## 2023-09-16 LAB — CMP14+EGFR
ALT: 22 IU/L (ref 0–44)
AST: 22 IU/L (ref 0–40)
Albumin: 4.5 g/dL (ref 3.9–4.9)
Alkaline Phosphatase: 72 IU/L (ref 44–121)
BUN/Creatinine Ratio: 10 (ref 10–24)
BUN: 10 mg/dL (ref 8–27)
Bilirubin Total: 2.4 mg/dL — AB (ref 0.0–1.2)
CO2: 22 mmol/L (ref 20–29)
Calcium: 9.6 mg/dL (ref 8.6–10.2)
Chloride: 103 mmol/L (ref 96–106)
Creatinine, Ser: 1.03 mg/dL (ref 0.76–1.27)
Globulin, Total: 2.5 g/dL (ref 1.5–4.5)
Glucose: 103 mg/dL — AB (ref 70–99)
Potassium: 4.7 mmol/L (ref 3.5–5.2)
Sodium: 140 mmol/L (ref 134–144)
Total Protein: 7 g/dL (ref 6.0–8.5)
eGFR: 80 mL/min/1.73 (ref >59)

## 2023-09-16 LAB — LIPID PANEL
Cholesterol, Total: 186 mg/dL (ref 100–199)
HDL: 41 mg/dL (ref >39)
LDL CALC COMMENT:: 4.5 ratio (ref 0.0–5.0)
LDL Chol Calc (NIH): 114 mg/dL — AB (ref 0–99)
Triglycerides: 174 mg/dL — AB (ref 0–149)
VLDL Cholesterol Cal: 31 mg/dL (ref 5–40)

## 2023-09-16 LAB — CBC WITH DIFFERENTIAL/PLATELET
Basophils Absolute: 0 x10E3/uL (ref 0.0–0.2)
Basos: 1 %
EOS (ABSOLUTE): 0.2 x10E3/uL (ref 0.0–0.4)
Eos: 3 %
Hematocrit: 50.2 % (ref 37.5–51.0)
Hemoglobin: 17 g/dL (ref 13.0–17.7)
Immature Grans (Abs): 0 x10E3/uL (ref 0.0–0.1)
Immature Granulocytes: 0 %
Lymphocytes Absolute: 1.9 x10E3/uL (ref 0.7–3.1)
Lymphs: 35 %
MCH: 32.1 pg (ref 26.6–33.0)
MCHC: 33.9 g/dL (ref 31.5–35.7)
MCV: 95 fL (ref 79–97)
Monocytes Absolute: 0.4 x10E3/uL (ref 0.1–0.9)
Monocytes: 7 %
Neutrophils Absolute: 2.9 x10E3/uL (ref 1.4–7.0)
Neutrophils: 54 %
Platelets: 161 x10E3/uL (ref 150–450)
RBC: 5.3 x10E6/uL (ref 4.14–5.80)
RDW: 12.4 % (ref 11.6–15.4)
WBC: 5.3 x10E3/uL (ref 3.4–10.8)

## 2023-09-16 LAB — PSA, TOTAL AND FREE
PSA, Free Pct: 30
PSA, Free: 0.21 ng/mL
Prostate Specific Ag, Serum: 0.7 ng/mL (ref 0.0–4.0)

## 2023-09-23 ENCOUNTER — Ambulatory Visit: Payer: Medicare Other

## 2023-09-23 ENCOUNTER — Ambulatory Visit: Payer: Self-pay | Admitting: Family Medicine

## 2023-09-23 DIAGNOSIS — Z1211 Encounter for screening for malignant neoplasm of colon: Secondary | ICD-10-CM

## 2023-09-23 DIAGNOSIS — Z0000 Encounter for general adult medical examination without abnormal findings: Secondary | ICD-10-CM | POA: Diagnosis not present

## 2023-09-23 NOTE — Patient Instructions (Signed)
 Jeremy Bennett , Thank you for taking time out of your busy schedule to complete your Annual Wellness Visit with me. I enjoyed our conversation and look forward to speaking with you again next year. I, as well as your care team,  appreciate your ongoing commitment to your health goals. Please review the following plan we discussed and let me know if I can assist you in the future. Your Game plan/ To Do List    Referrals: If you haven't heard from the office you've been referred to, please reach out to them at the phone provided.   Follow up Visits: We will see or speak with you next year for your Next Medicare AWV with our clinical staff on 09/25/24 at 8:40a.m. Have you seen your provider in the last 6 months (3 months if uncontrolled diabetes)? Yes  Clinician Recommendations:  Aim for 30 minutes of exercise or brisk walking, 6-8 glasses of water , and 5 servings of fruits and vegetables each day.       This is a list of the screenings recommended for you:  Health Maintenance  Topic Date Due   Medicare Annual Wellness Visit  09/22/2023   Colon Cancer Screening  12/15/2023   Flu Shot  04/25/2024*   DTaP/Tdap/Td vaccine (2 - Td or Tdap) 03/07/2031   Hepatitis C Screening  Completed   HPV Vaccine  Aged Out   Meningitis B Vaccine  Aged Out   Pneumococcal Vaccine for age over 16  Discontinued   COVID-19 Vaccine  Discontinued   Zoster (Shingles) Vaccine  Discontinued  *Topic was postponed. The date shown is not the original due date.    Advanced directives: (Declined) Advance directive discussed with you today. Even though you declined this today, please call our office should you change your mind, and we can give you the proper paperwork for you to fill out. Advance Care Planning is important because it:  [x]  Makes sure you receive the medical care that is consistent with your values, goals, and preferences  [x]  It provides guidance to your family and loved ones and reduces their decisional  burden about whether or not they are making the right decisions based on your wishes.  Follow the link provided in your after visit summary or read over the paperwork we have mailed to you to help you started getting your Advance Directives in place. If you need assistance in completing these, please reach out to us  so that we can help you!  See attachments for Preventive Care and Fall Prevention Tips.

## 2023-09-23 NOTE — Progress Notes (Signed)
 Subjective:   Jeremy Bennett is a 68 y.o. who presents for a Medicare Wellness preventive visit.  As a reminder, Annual Wellness Visits don't include a physical exam, and some assessments may be limited, especially if this visit is performed virtually. We may recommend an in-person follow-up visit with your provider if needed.  Visit Complete: Virtual I connected with  [name] on 09/23/23 by a audio enabled telemedicine application and verified that I am speaking with the correct person using two identifiers.  Patient Location: Home  Provider Location: Home Office  I discussed the limitations of evaluation and management by telemedicine. The patient expressed understanding and agreed to proceed.  Vital Signs: Because this visit was a virtual/telehealth visit, some criteria may be missing or patient reported. Any vitals not documented were not able to be obtained and vitals that have been documented are patient reported.  VideoDeclined- This patient declined Librarian, academic. Therefore the visit was completed with audio only.  Persons Participating in Visit: Patient.  AWV Questionnaire: No: Patient Medicare AWV questionnaire was not completed prior to this visit.        Objective:    There were no vitals filed for this visit. There is no height or weight on file to calculate BMI.     09/23/2023   10:39 AM 09/22/2022   11:42 AM 08/14/2021   12:11 PM 08/13/2020   10:36 AM 12/15/2018    8:19 AM 10/06/2016    9:02 PM 09/29/2016    9:26 AM  Advanced Directives  Does Patient Have a Medical Advance Directive? No Yes No Yes No Yes  Yes   Type of Furniture conservator/restorer;Living will  Living will;Healthcare Power of Attorney  Living will;Healthcare Power of [employer] Power of [place];Living will  Does patient want to make changes to medical advance directive?    No - Patient declined  No - Patient declined  No - Patient  declined   Copy of Healthcare Power of Attorney in Chart?  No - copy requested  No - copy requested  Yes  No - copy requested   Would patient like information on creating a medical advance directive?   No - Patient declined No - Patient declined No - Patient declined       Data saved with a previous flowsheet row definition    Current Medications (verified) Outpatient Encounter Medications as of 09/23/2023  Medication Sig   lisinopril -hydrochlorothiazide  (ZESTORETIC ) 20-12.5 MG tablet Take 1 tablet by mouth daily.   Multiple Vitamin (MULTIVITAMIN) tablet Take 1 tablet by mouth daily.   Polyethyl Glycol-Propyl Glycol (SYSTANE) 0.4-0.3 % SOLN Place 1 drop into both eyes daily as needed (Dry eyes).   triamcinolone  cream (KENALOG ) 0.1 % APPLY TO AFFECTED AREA TWICE A DAY   No facility-administered encounter medications on file as of 09/23/2023.    Allergies (verified) No known allergies   History: Past Medical History:  Diagnosis Date   ADD (attention deficit disorder)    Anxiety    Arthritis    Gout    Gout attack    History of asbestosis    Hypertension    Sleep apnea    tested more than 5 yrs ago   Past Surgical History:  Procedure Laterality Date   COLONOSCOPY     COLONOSCOPY N/A 12/15/2018   Procedure: COLONOSCOPY;  Surgeon: Golda Claudis PENNER, MD;  [facility]: [facility];  Service: Endoscopy;  Laterality: N/A;   FRACTURE  SURGERY     left clavicle   1972   JOINT REPLACEMENT     KNEE ARTHROSCOPY     right   POLYPECTOMY  12/15/2018   Procedure: POLYPECTOMY;  Surgeon: Golda Claudis PENNER, MD;  Location: [facility];  Service: Endoscopy;;  transverse   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   Procedure: TOTAL KNEE ARTHROPLASTY;  Surgeon: Maude Herald, MD;  Location: [facility] OR;  Service: Orthopedics;  Laterality: Right;   TOTAL KNEE ARTHROPLASTY Left 10/06/2016   Procedure: TOTAL KNEE ARTHROPLASTY;  Surgeon: Herald Maude, MD;   Location: [facility] OR;  Service: Orthopedics;  Laterality: Left;   Family History  Problem Relation Age of Onset   Heart disease Father 66       heart attack   HIV/AIDS Father    Cancer Sister        uterine   Social History   Socioeconomic History   Marital status: Legally Separated    Spouse name: [name]   Number of children: Not on file   Years of education: Not on file   Highest education level: Some college, no degree  Occupational History   Not on file  Tobacco Use   Smoking status: Never   Smokeless tobacco: Never  Vaping Use   Vaping status: Never Used  Substance and Sexual Activity   Alcohol use: Yes    Alcohol/week: 1.0 standard drink of alcohol    Types: 1 Cans of beer per week    Comment: in a week's time   Drug use: No   Sexual activity: Not on file  Other Topics Concern   Not on file  Social History Narrative   They own property at [facility] and visit there frequently   Social Drivers of Health   Financial Resource Strain: Low Risk  (09/11/2023)   Overall Financial Resource Strain (CARDIA)    Difficulty of Paying Living Expenses: Not very hard  Food Insecurity: No Food Insecurity (09/11/2023)   Hunger Vital Sign    Worried About Running Out of Food in the Last Year: Never true    Ran Out of Food in the Last Year: Never true  Transportation Needs: No Transportation Needs (09/11/2023)   PRAPARE - Administrator, Civil Service (Medical): No    Lack of Transportation (Non-Medical): No  Physical Activity: Insufficiently Active (09/11/2023)   Exercise Vital Sign    Days of Exercise per Week: 3 days    Minutes of Exercise per Session: 30 min  Stress: Stress Concern Present (09/11/2023)   [employer] of Occupational Health - Occupational Stress Questionnaire    Feeling of Stress: Very much  Social Connections: Moderately Isolated (09/11/2023)   Social Connection and Isolation Panel    Frequency of Communication with Friends and Family: Three  times a week    Frequency of Social Gatherings with Friends and Family: Once a week    Attends Religious Services: More than 4 times per year    Active Member of [employer] or Organizations: No    Attends Engineer, structural: Not on file    Marital Status: Divorced    Tobacco Counseling Counseling given: Not Answered    Clinical Intake:  Pre-visit preparation completed: Yes  Pain : No/denies pain     Nutritional Risks: None Diabetes: No  Lab Results  Component Value Date   HGBA1C 5.0 10/14/2022     How often do you need to have someone help  you when you read instructions, pamphlets, or other written materials from your doctor or pharmacy?: 1 - Never  Interpreter Needed?: No  Information entered by :: alia t/cma   Activities of Daily Living     09/22/2023    1:20 PM  In your present state of health, do you have any difficulty performing the following activities:  Hearing? 0  Vision? 0  Difficulty concentrating or making decisions? 0  Walking or climbing stairs? 0  Dressing or bathing? 0  Doing errands, shopping? 0  Preparing Food and eating ? N  Using the Toilet? N  In the past six months, have you accidently leaked urine? N  Do you have problems with loss of bowel control? N  Managing your Medications? N  Managing your Finances? N  Housekeeping or managing your Housekeeping? N    Patient Care Team: Dettinger, Fonda LABOR, MD as PCP - General (Family Medicine) Bertrum Rosina HERO, RN as [facility] Care Management  I have updated your Care Teams any recent Medical Services you may have received from other providers in the past year.     Assessment:   This is a routine wellness examination for Sotero.  Hearing/Vision screen Hearing Screening - Comments:: Pt denies hearing dif Vision Screening - Comments:: Pt wear glasses/pt goes to [facility] [place]/last ov last wk 2025   Goals Addressed             This Visit's Progress    Patient Stated   On  track    08/14/2021 AWV Goal: Exercise for General Health  Patient will verbalize understanding of the benefits of increased physical activity: Exercising regularly is important. It will improve your overall fitness, flexibility, and endurance. Regular exercise also will improve your overall health. It can help you control your weight, reduce stress, and improve your bone density. Over the next year, patient will increase physical activity as tolerated with a goal of at least 150 minutes of moderate physical activity per week.  You can tell that you are exercising at a moderate intensity if your heart starts beating faster and you start breathing faster but can still hold a conversation. Moderate-intensity exercise ideas include: Walking 1 mile (1.6 km) in about 15 minutes Biking Hiking Golfing Dancing Water  aerobics Patient will verbalize understanding of everyday activities that increase physical activity by providing examples like the following: Yard work, such as: Insurance underwriter Gardening Washing windows or floors Patient will be able to explain general safety guidelines for exercising:  Before you start a new exercise program, talk with your health care provider. Do not exercise so much that you hurt yourself, feel dizzy, or get very short of breath. Wear comfortable clothes and wear shoes with good support. Drink plenty of water  while you exercise to prevent dehydration or heat stroke. Work out until your breathing and your heartbeat get faster.           Patient Stated       Would like to loose a little more wt       Depression Screen     09/23/2023   10:40 AM 09/15/2023    1:31 PM 08/05/2023    1:45 PM 10/14/2022    2:19 PM 09/24/2022    2:48 PM 09/22/2022   11:40 AM 03/23/2022   12:01 PM  PHQ 2/9 Scores  PHQ - 2 Score 0 0 0 0 0 0 0  PHQ- 9 Score 0 2  1       Fall Risk      09/23/2023   10:37 AM 09/22/2023    1:20 PM 09/15/2023    1:31 PM 08/05/2023    1:48 PM 12/25/2022    2:04 PM  Fall Risk   Falls in the past year? 0 0 0 0 0  Number falls in past yr: 0  0 0 0  Injury with Fall? 0  0 0 0  Risk for fall due to : No Fall Risks  No Fall Risks    Follow up Falls evaluation completed  Falls evaluation completed Falls evaluation completed Falls evaluation completed    MEDICARE RISK AT HOME:  Medicare Risk at Home Any stairs in or around the home?: (Patient-Rptd) Yes If so, are there any without handrails?: (Patient-Rptd) No Home free of loose throw rugs in walkways, pet beds, electrical cords, etc?: (Patient-Rptd) No Adequate lighting in your home to reduce risk of falls?: (Patient-Rptd) Yes Life alert?: (Patient-Rptd) No Use of a cane, walker or w/c?: (Patient-Rptd) No Grab bars in the bathroom?: (Patient-Rptd) No Shower chair or bench in shower?: (Patient-Rptd) No Elevated toilet seat or a handicapped toilet?: (Patient-Rptd) No  TIMED UP AND GO:  Was the test performed?  no  Cognitive Function: 6CIT completed        09/23/2023   10:41 AM 09/22/2022   11:42 AM 08/14/2021   12:09 PM 08/13/2020   10:38 AM  6CIT Screen  What Year? 0 points 0 points 0 points 0 points  What month? 0 points 0 points 0 points 0 points  What time? 0 points 0 points 0 points 0 points  Count back from 20 0 points 0 points 0 points 0 points  Months in reverse 0 points 0 points 0 points 0 points  Repeat phrase 0 points 0 points 0 points 2 points  Total Score 0 points 0 points 0 points 2 points    Immunizations Immunization History  Administered Date(s) Administered   Fluad Quad(high Dose 65+) 03/23/2022   Fluad Trivalent(High Dose 65+) 10/14/2022   Influenza,inj,Quad PF,6+ Mos 10/08/2016, 02/23/2018, 12/02/2018, 01/02/2020   PFIZER(Purple Top)SARS-COV-2 Vaccination 10/08/2019, 11/08/2019   Tdap 03/06/2021    Screening Tests Health Maintenance  Topic Date Due    Colonoscopy  12/15/2023   INFLUENZA VACCINE  04/25/2024 (Originally 08/27/2023)   Medicare Annual Wellness (AWV)  09/22/2024   DTaP/Tdap/Td (2 - Td or Tdap) 03/07/2031   Hepatitis C Screening  Completed   HPV VACCINES  Aged Out   Meningococcal B Vaccine  Aged Out   Pneumococcal Vaccine: 50+ Years  Discontinued   COVID-19 Vaccine  Discontinued   Zoster Vaccines- Shingrix  Discontinued    Health Maintenance  Health Maintenance Due  Topic Date Due   Colonoscopy  12/15/2023   Health Maintenance Items Addressed: Referral sent to GI for colonoscopy  Additional Screening:  Vision Screening: Recommended annual ophthalmology exams for early detection of glaucoma and other disorders of the eye. Would you like a referral to an eye doctor? No    Dental Screening: Recommended annual dental exams for proper oral hygiene  Community Resource Referral / Chronic Care Management: CRR required this visit?  No   CCM required this visit?  No   Plan:    I have personally reviewed and noted the following in the patient's chart:   Medical and social history Use of alcohol, tobacco or illicit drugs  Current medications and supplements including opioid  prescriptions. Patient is not currently taking opioid prescriptions. Functional ability and status Nutritional status Physical activity Advanced directives List of other physicians Hospitalizations, surgeries, and ER visits in previous 12 months Vitals Screenings to include cognitive, depression, and falls Referrals and appointments  In addition, I have reviewed and discussed with patient certain preventive protocols, quality metrics, and best practice recommendations. A written personalized care plan for preventive services as well as general preventive health recommendations were provided to patient.   Ozie Ned, CMA   09/23/2023   After Visit Summary: (MyChart) Due to this being a telephonic visit, the after visit summary with patients  personalized plan was offered to patient via MyChart   Notes: Nothing significant to report at this time.

## 2023-09-24 ENCOUNTER — Telehealth: Payer: Self-pay | Admitting: Family Medicine

## 2023-09-24 NOTE — Telephone Encounter (Signed)
 Copied from CRM [id number]. Topic: Referral - Question >> Sep 24, 2023 10:41 AM Tonda B wrote: Reason for CRM: patient is calling to state that he would not like to go to  [facility] [address] [place] [id number] [phone] He would like a different location patient said he was referred to another one and just can not think of the name of it please give the patient a call back [phone] (M)

## 2023-11-09 ENCOUNTER — Other Ambulatory Visit: Payer: Self-pay | Admitting: *Deleted

## 2023-11-09 NOTE — Patient Outreach (Signed)
 Complex Care Management   Visit Note  11/09/2023  Name:  Jeremy Bennett MRN: [id number] DOB: 07-22-1955  Situation: Referral received for Complex Care Management related to HLD I obtained verbal consent from Patient.  Visit completed with Patient  on the phone  Background:   Past Medical History:  Diagnosis Date   ADD (attention deficit disorder)    Anxiety    Arthritis    Gout    Gout attack    History of asbestosis    Hypertension    Sleep apnea    tested more than 5 yrs ago    Assessment: Patient Reported Symptoms:  Cognitive Cognitive Status: No symptoms reported Cognitive/Intellectual Conditions Management [RPT]: None reported or documented in medical history or problem list   Health Maintenance Behaviors: Annual physical exam Healing Pattern: Average Health Facilitated by: Rest  Neurological Neurological Review of Symptoms: No symptoms reported Neurological Self-Management Outcome: 4 (good)  HEENT HEENT Symptoms Reported: Eye dryness, Tearing HEENT Management Strategies: Medication therapy, Routine screening HEENT Self-Management Outcome: 4 (good)    Cardiovascular Cardiovascular Symptoms Reported: No symptoms reported Cardiovascular Management Strategies: Routine screening, Diet modification Cardiovascular Self-Management Outcome: 4 (good)  Respiratory Respiratory Symptoms Reported: No symptoms reported Respiratory Management Strategies: Routine screening Respiratory Self-Management Outcome: 4 (good)  Endocrine Endocrine Symptoms Reported: No symptoms reported Is patient diabetic?: No Endocrine Self-Management Outcome: 4 (good)  Gastrointestinal Gastrointestinal Symptoms Reported: No symptoms reported Gastrointestinal Self-Management Outcome: 4 (good)    Genitourinary Genitourinary Symptoms Reported: No symptoms reported Genitourinary Self-Management Outcome: 4 (good)  Integumentary Integumentary Symptoms Reported: No symptoms reported Skin Management  Strategies: Routine screening Skin Self-Management Outcome: 4 (good)  Musculoskeletal Musculoskelatal Symptoms Reviewed: No symptoms reported Musculoskeletal Management Strategies: Routine screening Musculoskeletal Self-Management Outcome: 4 (good) Falls in the past year?: No Number of falls in past year: 1 or less Was there an injury with Fall?: No Fall Risk Category Calculator: 0 Patient Fall Risk Level: Low Fall Risk Patient at Risk for Falls Due to: No Fall Risks Fall risk Follow up: Falls evaluation completed  Psychosocial Psychosocial Symptoms Reported: No symptoms reported Behavioral Management Strategies: Coping strategies Behavioral Health Self-Management Outcome: 4 (good) Major Change/Loss/Stressor/Fears (CP): Denies Techniques to Cope with Loss/Stress/Change: Diversional activities      11/09/2023    PHQ2-9 Depression Screening   Little interest or pleasure in doing things Not at all  Feeling down, depressed, or hopeless Not at all  PHQ-2 - Total Score 0  Trouble falling or staying asleep, or sleeping too much    Feeling tired or having little energy    Poor appetite or overeating     Feeling bad about yourself - or that you are a failure or have let yourself or your family down    Trouble concentrating on things, such as reading the newspaper or watching television    Moving or speaking so slowly that other people could have noticed.  Or the opposite - being so fidgety or restless that you have been moving around a lot more than usual    Thoughts that you would be better off dead, or hurting yourself in some way    PHQ2-9 Total Score    If you checked off any problems, how difficult have these problems made it for you to do your work, take care of things at home, or get along with other people    Depression Interventions/Treatment      Vitals:   11/07/23 1211  BP: 130/81    Medications Reviewed  Today     Reviewed by Bertrum Rosina HERO, RN (Registered Nurse) on  11/09/23 at 1206  Med List Status: <None>   Medication Order Taking? Sig Documenting Provider Last Dose Status Informant  lisinopril -hydrochlorothiazide  (ZESTORETIC ) 20-12.5 MG tablet [id number] Yes Take 1 tablet by mouth daily. Dettinger, Fonda LABOR, MD  Active   Multiple Vitamin (MULTIVITAMIN) tablet [id number] Yes Take 1 tablet by mouth daily. [provider]  Active   Polyethyl Glycol-Propyl Glycol (SYSTANE) 0.4-0.3 % SOLN [id number] Yes Place 1 drop into both eyes daily as needed (Dry eyes). [provider]  Active Self  triamcinolone  cream (KENALOG ) 0.1 % [id number] Yes APPLY TO AFFECTED AREA TWICE A DAY Dettinger, Fonda LABOR, MD  Active             Recommendation:   Continue Current Plan of Care  Follow Up Plan:   Closing From:  Complex Care Management Patient has met all care management goals. Care Management case will be closed. Patient has been provided contact information should new needs arise.   Rosina Bertrum, BSN RN [facility], [facility] RN Care Manager Direct Dial: [phone]  Fax: [phone]

## 2023-11-09 NOTE — Patient Instructions (Signed)
 Visit Information  Thank you for taking time to visit with me today. Please don't hesitate to contact me if I can be of assistance to you before our next scheduled appointment.  Your next care management appointment is no further scheduled appointments.    Closing From: Complex Care Management. Patient has met all care management goals. Care Management case will be closed. Patient has been provided contact information should new needs arise.   Please call the care guide team at [phone] if you need to cancel, schedule, or reschedule an appointment.   Please call the Suicide and Crisis Lifeline: 988 call the [place]  National Suicide Prevention Lifeline: 930-265-7590 or TTY: 540-771-0638 TTY 563-023-0381) to talk to a trained counselor call 1-800-273-TALK (toll free, 24 hour hotline) if you are experiencing a Mental Health or Behavioral Health Crisis or need someone to talk to.  Rosina Forte, BSN RN [facility], [facility] RN Care Manager Direct Dial: [phone]  Fax: [phone]

## 2023-11-12 ENCOUNTER — Encounter (INDEPENDENT_AMBULATORY_CARE_PROVIDER_SITE_OTHER): Payer: Self-pay | Admitting: *Deleted

## 2023-12-07 ENCOUNTER — Encounter: Payer: Self-pay | Admitting: *Deleted

## 2023-12-07 ENCOUNTER — Other Ambulatory Visit: Payer: Self-pay | Admitting: *Deleted

## 2023-12-07 ENCOUNTER — Telehealth: Payer: Self-pay

## 2023-12-07 MED ORDER — PEG 3350-KCL-NA BICARB-NACL 420 G PO SOLR: 4000.0000 mL | Freq: Once | ORAL | 0 refills | Status: AC

## 2023-12-07 NOTE — Telephone Encounter (Signed)
Ok to schedule.  Room : any   Thanks,  Vista Lawman, MD Gastroenterology and Hepatology [facility]

## 2023-12-07 NOTE — Telephone Encounter (Signed)
 Who is your primary care physician: Dr.Dettinger  Reasons for the colonoscopy: hx polyps  Have you had a colonoscopy before?  Yes 12/15/2018  Do you have family history of colon cancer? no  Previous colonoscopy with polyps removed? yes  Do you have a history colorectal cancer?   no  Are you diabetic? If yes, Type 1 or Type 2?    no  Do you have a prosthetic or mechanical heart valve? no  Do you have a pacemaker/defibrillator?   no  Have you had endocarditis/atrial fibrillation? no  Have you had joint replacement within the last 12 months?  no  Do you tend to be constipated or have to use laxatives? no  Do you have any history of drugs or alchohol?  no  Do you use supplemental oxygen?  no  Have you had a stroke or heart attack within the last 6 months? no  Do you take weight loss medication?  no   Do you take any blood-thinning medications such as: (aspirin , warfarin, Plavix, Aggrenox)  no  If yes we need the name, milligram, dosage and who is prescribing doctor  Current Outpatient Medications on File Prior to Visit  Medication Sig Dispense Refill   lisinopril -hydrochlorothiazide  (ZESTORETIC ) 20-12.5 MG tablet Take 1 tablet by mouth daily. 90 tablet 3   No current facility-administered medications on file prior to visit.    Allergies  Allergen Reactions   No Known Allergies      Pharmacy: [facility]  Primary Insurance Name: [facility] [id number]  Best number where you can be reached: [phone]

## 2023-12-07 NOTE — Telephone Encounter (Signed)
 Questionnaire from recall, no referral needed

## 2023-12-07 NOTE — Telephone Encounter (Signed)
 Pt has been scheduled for 01/06/24. Instructions mailed and prep sent to pharmacy.

## 2023-12-07 NOTE — Telephone Encounter (Signed)
 LMOVM to return call.

## 2023-12-21 ENCOUNTER — Encounter: Payer: Self-pay | Admitting: *Deleted

## 2024-01-03 ENCOUNTER — Encounter (HOSPITAL_COMMUNITY): Payer: Self-pay

## 2024-01-03 ENCOUNTER — Other Ambulatory Visit: Payer: Self-pay

## 2024-01-03 ENCOUNTER — Encounter (HOSPITAL_COMMUNITY)
Admission: RE | Admit: 2024-01-03 | Discharge: 2024-01-03 | Disposition: A | Source: Ambulatory Visit | Attending: Gastroenterology

## 2024-01-06 ENCOUNTER — Ambulatory Visit (HOSPITAL_COMMUNITY): Admitting: Certified Registered"

## 2024-01-06 ENCOUNTER — Other Ambulatory Visit: Payer: Self-pay

## 2024-01-06 ENCOUNTER — Encounter (HOSPITAL_COMMUNITY): Payer: Self-pay | Admitting: Gastroenterology

## 2024-01-06 ENCOUNTER — Encounter (HOSPITAL_COMMUNITY): Admission: RE | Disposition: A | Payer: Self-pay | Source: Home / Self Care | Attending: Gastroenterology

## 2024-01-06 ENCOUNTER — Ambulatory Visit (HOSPITAL_COMMUNITY)
Admission: RE | Admit: 2024-01-06 | Discharge: 2024-01-06 | Disposition: A | Attending: Gastroenterology | Admitting: Gastroenterology

## 2024-01-06 DIAGNOSIS — D125 Benign neoplasm of sigmoid colon: Secondary | ICD-10-CM

## 2024-01-06 DIAGNOSIS — K5730 Diverticulosis of large intestine without perforation or abscess without bleeding: Secondary | ICD-10-CM | POA: Diagnosis not present

## 2024-01-06 DIAGNOSIS — Z1211 Encounter for screening for malignant neoplasm of colon: Secondary | ICD-10-CM | POA: Diagnosis not present

## 2024-01-06 DIAGNOSIS — K5790 Diverticulosis of intestine, part unspecified, without perforation or abscess without bleeding: Secondary | ICD-10-CM | POA: Diagnosis not present

## 2024-01-06 DIAGNOSIS — K635 Polyp of colon: Secondary | ICD-10-CM

## 2024-01-06 DIAGNOSIS — G4730 Sleep apnea, unspecified: Secondary | ICD-10-CM | POA: Insufficient documentation

## 2024-01-06 DIAGNOSIS — I10 Essential (primary) hypertension: Secondary | ICD-10-CM | POA: Diagnosis not present

## 2024-01-06 DIAGNOSIS — Z8249 Family history of ischemic heart disease and other diseases of the circulatory system: Secondary | ICD-10-CM | POA: Insufficient documentation

## 2024-01-06 DIAGNOSIS — Z860100 Personal history of colon polyps, unspecified: Secondary | ICD-10-CM | POA: Diagnosis present

## 2024-01-06 DIAGNOSIS — K648 Other hemorrhoids: Secondary | ICD-10-CM | POA: Diagnosis not present

## 2024-01-06 HISTORY — PX: COLONOSCOPY: SHX5424

## 2024-01-06 LAB — HM COLONOSCOPY

## 2024-01-06 SURGERY — COLONOSCOPY
Anesthesia: Monitor Anesthesia Care

## 2024-01-06 MED ORDER — LIDOCAINE HCL (CARDIAC) PF 100 MG/5ML IV SOSY
PREFILLED_SYRINGE | INTRAVENOUS | Status: DC | PRN
  Administered 2024-01-06: 100 mg via INTRAVENOUS

## 2024-01-06 MED ORDER — LACTATED RINGERS IV SOLN: INTRAVENOUS | Status: DC

## 2024-01-06 MED ORDER — PROPOFOL 10 MG/ML IV BOLUS
INTRAVENOUS | Status: DC | PRN
  Administered 2024-01-06: 150 ug/kg/min via INTRAVENOUS
  Administered 2024-01-06: 100 mg via INTRAVENOUS

## 2024-01-06 NOTE — Transfer of Care (Signed)
 Immediate Anesthesia Transfer of Care Note  Patient: Jeremy Bennett  Procedure(s) Performed: COLONOSCOPY  Patient Location: Short Stay  Anesthesia Type:General  Level of Consciousness: awake, alert , oriented, and patient cooperative  Airway & Oxygen Therapy: Patient Spontanous Breathing  Post-op Assessment: Report given to RN and Post -op Vital signs reviewed and stable  Post vital signs: Reviewed and stable  Last Vitals:  Vitals Value Taken Time  BP 87/56 01/06/24 12:10  Temp 36.9 C 01/06/24 12:10  Pulse 94 01/06/24 12:10  Resp 16 01/06/24 12:10  SpO2 93 % 01/06/24 12:10    Last Pain:  Vitals:   01/06/24 1210  TempSrc: Oral  PainSc: Asleep         Complications: No notable events documented.

## 2024-01-06 NOTE — Op Note (Signed)
 [facility] Patient Name: Jeremy Bennett Procedure Date: 01/06/2024 11:37 AM MRN: [id number] Date of Birth: 1956/01/01 Attending MD: Deatrice Dine , MD, [id number] CSN: [id number] Age: 68 Admit Type: Outpatient Procedure:                Colonoscopy Indications:              High risk colon cancer surveillance: Personal                            history of colonic polyps Providers:                Deatrice Dine, MD, Madelin Hunter, RN, Dorcas Lenis, Technician Referring MD:              Medicines:                Monitored Anesthesia Care Complications:            No immediate complications. Estimated Blood Loss:     Estimated blood loss was minimal. Procedure:                Pre-Anesthesia Assessment:                           - Prior to the procedure, a History and Physical                            was performed, and patient medications and                            allergies were reviewed. The patient's tolerance of                            previous anesthesia was also reviewed. The risks                            and benefits of the procedure and the sedation                            options and risks were discussed with the patient.                            All questions were answered, and informed consent                            was obtained. Prior Anticoagulants: The patient has                            taken no anticoagulant or antiplatelet agents                            except for aspirin . ASA Grade Assessment: II - A  patient with mild systemic disease. After reviewing                            the risks and benefits, the patient was deemed in                            satisfactory condition to undergo the procedure.                           After obtaining informed consent, the colonoscope                            was passed under direct vision. Throughout the                             procedure, the patient's blood pressure, pulse, and                            oxygen saturations were monitored continuously. The                            [id number] ([id number]) Colon was introduced through                            the anus and advanced to the the cecum, identified                            by appendiceal orifice and ileocecal valve. The                            colonoscopy was performed without difficulty. The                            patient tolerated the procedure well. The quality                            of the bowel preparation was evaluated using the                            BBPS [facility] Bowel Preparation Scale) with scores                            of: Right Colon = 3, Transverse Colon = 3 and Left                            Colon = 3 (entire mucosa seen well with no residual                            staining, small fragments of stool or opaque                            liquid). The total BBPS score equals 9. The  ileocecal valve, appendiceal orifice, and rectum                            were photographed. Scope In: 11:53:12 AM Scope Out: 12:06:58 PM Scope Withdrawal Time: 0 hours 11 minutes 9 seconds  Total Procedure Duration: 0 hours 13 minutes 46 seconds  Findings:      The perianal and digital rectal examinations were normal.      A 4 mm polyp was found in the sigmoid colon. The polyp was sessile. The       polyp was removed with a cold snare. Resection and retrieval were       complete.      A few small-mouthed diverticula were found in the right colon.      Non-bleeding internal hemorrhoids were found during retroflexion. The       hemorrhoids were small. Impression:               - One 4 mm polyp in the sigmoid colon, removed with                            a cold snare. Resected and retrieved.                           - Diverticulosis in the right colon.                           - Non-bleeding internal  hemorrhoids. Moderate Sedation:      Per Anesthesia Care Recommendation:           - Patient has a contact number available for                            emergencies. The signs and symptoms of potential                            delayed complications were discussed with the                            patient. Return to normal activities tomorrow.                            Written discharge instructions were provided to the                            patient.                           - Resume previous diet.                           - Continue present medications.                           - Await pathology results.                           - Repeat colonoscopy in 7-10 years for surveillance  based on pathology results.                           - Return to primary care physician as previously                            scheduled. Procedure Code(s):        --- Professional ---                           [phone], Colonoscopy, flexible; with removal of                            tumor(s), polyp(s), or other lesion(s) by snare                            technique Diagnosis Code(s):        --- Professional ---                           Z86.010, Personal history of colonic polyps                           D12.5, Benign neoplasm of sigmoid colon                           K64.8, Other hemorrhoids                           K57.30, Diverticulosis of large intestine without                            perforation or abscess without bleeding CPT copyright 2022 American Medical Association. All rights reserved. The codes documented in this report are preliminary and upon coder review may  be revised to meet current compliance requirements. Deatrice Dine, MD Deatrice Dine, MD 01/06/2024 12:13:11 PM This report has been signed electronically. Number of Addenda: 0

## 2024-01-06 NOTE — Discharge Instructions (Signed)

## 2024-01-06 NOTE — H&P (Signed)
 Primary Care Physician:  Dettinger, Fonda LABOR, MD Primary Gastroenterologist:  Dr. Cinderella  Pre-Procedure History & Physical: HPI:  Jeremy Bennett is a 68 y.o. male is here for a colonoscopy for colon polyp surveillance .  Patient denies any family history of colorectal cancer.  No melena or hematochezia.  No abdominal pain or unintentional weight loss.  No change in bowel habits.  Overall feels well from a GI standpoint.  2020 - One small polyp in the transverse colon, removed with a cold snare. Resected and retrieved. - Two small polyps in the transverse colon. Biopsied. - Diverticulosis in the sigmoid colon. - The distal rectum and anal verge are normal on retroflexion view.  A. COLON, TRANSVERSE, POLYPECTOMY:  Tubular adenoma (s).  No high-grade dysplasia or malignanc  Past Medical History:  Diagnosis Date   ADD (attention deficit disorder)    Anxiety    Arthritis    Gout    Gout attack    History of asbestosis    Hypertension    Sleep apnea    last test was negative due to WT loss    Past Surgical History:  Procedure Laterality Date   COLONOSCOPY     COLONOSCOPY N/A 12/15/2018   Procedure: COLONOSCOPY;  Surgeon: Golda Claudis PENNER, MD;  Location: [facility];  Service: Endoscopy;  Laterality: N/A;   FRACTURE SURGERY     left clavicle   1972   JOINT REPLACEMENT     KNEE ARTHROSCOPY     right   ORIF ANKLE FRACTURE Right    screws   POLYPECTOMY  12/15/2018   Procedure: POLYPECTOMY;  Surgeon: Golda Claudis PENNER, MD;  Location: [facility];  Service: Endoscopy;;  transverse   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   Procedure: TOTAL KNEE ARTHROPLASTY;  Surgeon: Maude Herald, MD;  Location: [facility] OR;  Service: Orthopedics;  Laterality: Right;   TOTAL KNEE ARTHROPLASTY Left 10/06/2016   Procedure: TOTAL KNEE ARTHROPLASTY;  Surgeon: Herald Maude, MD;  Location: [facility] OR;  Service: Orthopedics;  Laterality: Left;    Prior to Admission  medications  Medication Sig Start Date End Date Taking? Authorizing Provider  lisinopril -hydrochlorothiazide  (ZESTORETIC ) 20-12.5 MG tablet Take 1 tablet by mouth daily. 09/15/23  Yes Dettinger, Fonda LABOR, MD    Allergies as of 12/07/2023 - Review Complete 12/07/2023  Allergen Reaction Noted   No known allergies  05/18/2016    Family History  Problem Relation Age of Onset   Heart disease Father 94       heart attack   HIV/AIDS Father    Cancer Sister        uterine    Social History   Socioeconomic History   Marital status: Legally Separated    Spouse name: [name]   Number of children: Not on file   Years of education: Not on file   Highest education level: Some college, no degree  Occupational History   Not on file  Tobacco Use   Smoking status: Never   Smokeless tobacco: Never  Vaping Use   Vaping status: Never Used  Substance and Sexual Activity   Alcohol use: Yes    Alcohol/week: 1.0 standard drink of alcohol    Types: 1 Cans of beer per week    Comment: in a week's time   Drug use: No   Sexual activity: Not on file  Other Topics Concern   Not on file  Social History Narrative   They own  property at [facility] and visit there frequently   Social Drivers of Health   Tobacco Use: Low Risk (01/06/2024)   Patient History    Smoking Tobacco Use: Never    Smokeless Tobacco Use: Never    Passive Exposure: Not on file  Financial Resource Strain: Low Risk (09/11/2023)   Overall Financial Resource Strain (CARDIA)    Difficulty of Paying Living Expenses: Not very hard  Food Insecurity: No Food Insecurity (09/11/2023)   Epic    Worried About Programme Researcher, Broadcasting/film/video in the Last Year: Never true    Ran Out of Food in the Last Year: Never true  Transportation Needs: No Transportation Needs (09/11/2023)   Epic    Lack of Transportation (Medical): No    Lack of Transportation (Non-Medical): No  Physical Activity: Insufficiently Active (09/11/2023)   Exercise Vital Sign     Days of Exercise per Week: 3 days    Minutes of Exercise per Session: 30 min  Stress: Stress Concern Present (09/11/2023)   [employer] of Occupational Health - Occupational Stress Questionnaire    Feeling of Stress: Very much  Social Connections: Moderately Isolated (09/11/2023)   Social Connection and Isolation Panel    Frequency of Communication with Friends and Family: Three times a week    Frequency of Social Gatherings with Friends and Family: Once a week    Attends Religious Services: More than 4 times per year    Active Member of Clubs or Organizations: No    Attends Banker Meetings: Not on file    Marital Status: Divorced  Intimate Partner Violence: Not At Risk (09/23/2023)   Epic    Fear of Current or Ex-Partner: No    Emotionally Abused: No    Physically Abused: No    Sexually Abused: No  Depression (PHQ2-9): Low Risk (11/09/2023)   Depression (PHQ2-9)    PHQ-2 Score: 0  Alcohol Screen: Low Risk (09/23/2023)   Alcohol Screen    Last Alcohol Screening Score (AUDIT): 0  Housing: Low Risk (09/11/2023)   Epic    Unable to Pay for Housing in the Last Year: No    Number of Times Moved in the Last Year: 0    Homeless in the Last Year: No  Utilities: Not At Risk (08/05/2023)   Epic    Threatened with loss of utilities: No  Health Literacy: Adequate Health Literacy (08/05/2023)   B1300 Health Literacy    Frequency of need for help with medical instructions: Never    Review of Systems: See HPI, otherwise negative ROS  Physical Exam: Vital signs in last 24 hours: Temp:  [98.1 F (36.7 C)] 98.1 F (36.7 C) (12/11 1113) Pulse Rate:  [93] 93 (12/11 1113) Resp:  [11] 11 (12/11 1113) BP: (155)/(95) 155/95 (12/11 1113) SpO2:  [95 %] 95 % (12/11 1113) Weight:  [115.7 kg] 115.7 kg (12/11 1113)   General:   Alert,  Well-developed, well-nourished, pleasant and cooperative in NAD Head:  Normocephalic and atraumatic. Eyes:  Sclera clear, no icterus.    Conjunctiva pink. Ears:  Normal auditory acuity. Nose:  No deformity, discharge,  or lesions. Msk:  Symmetrical without gross deformities. Normal posture. Extremities:  Without clubbing or edema. Neurologic:  Alert and  oriented x4;  grossly normal neurologically. Skin:  Intact without significant lesions or rashes. Psych:  Alert and cooperative. Normal mood and affect.  Impression/Plan: BOGDAN VIVONA is a 68 y.o. male is here for a colonoscopy for colon polyp surveillance .  Proceed with colonoscopy   The risks of the procedure including infection, bleed, or perforation as well as benefits, limitations, alternatives and imponderables have been reviewed with the patient. Questions have been answered. All parties agreeable.

## 2024-01-06 NOTE — Anesthesia Preprocedure Evaluation (Signed)
 Anesthesia Evaluation  Patient identified by MRN, date of birth, ID band Patient awake    Reviewed: Allergy & Precautions, H&P , NPO status , Patient's Chart, lab work & pertinent test results, reviewed documented beta blocker date and time   Airway Mallampati: II  TM Distance: >3 FB Neck ROM: full    Dental no notable dental hx.    Pulmonary sleep apnea    Pulmonary exam normal breath sounds clear to auscultation       Cardiovascular Exercise Tolerance: Good hypertension,  Rhythm:regular Rate:Normal     Neuro/Psych  PSYCHIATRIC DISORDERS Anxiety     negative neurological ROS     GI/Hepatic negative GI ROS, Neg liver ROS,,,  Endo/Other  negative endocrine ROS    Renal/GU negative Renal ROS  negative genitourinary   Musculoskeletal   Abdominal   Peds  Hematology negative hematology ROS (+)   Anesthesia Other Findings   Reproductive/Obstetrics negative OB ROS                              Anesthesia Physical Anesthesia Plan  ASA: 2  Anesthesia Plan: MAC   Post-op Pain Management:    Induction:   PONV Risk Score and Plan: Propofol  infusion  Airway Management Planned:   Additional Equipment:   Intra-op Plan:   Post-operative Plan:   Informed Consent: I have reviewed the patients History and Physical, chart, labs and discussed the procedure including the risks, benefits and alternatives for the proposed anesthesia with the patient or authorized representative who has indicated his/her understanding and acceptance.     Dental Advisory Given  Plan Discussed with: CRNA  Anesthesia Plan Comments:         Anesthesia Quick Evaluation

## 2024-01-07 ENCOUNTER — Encounter (HOSPITAL_COMMUNITY): Payer: Self-pay | Admitting: Gastroenterology

## 2024-01-11 ENCOUNTER — Encounter (INDEPENDENT_AMBULATORY_CARE_PROVIDER_SITE_OTHER): Payer: Self-pay | Admitting: *Deleted

## 2024-01-14 ENCOUNTER — Ambulatory Visit (INDEPENDENT_AMBULATORY_CARE_PROVIDER_SITE_OTHER): Payer: Self-pay | Admitting: Gastroenterology

## 2024-01-14 LAB — SURGICAL PATHOLOGY

## 2024-01-14 NOTE — Anesthesia Postprocedure Evaluation (Signed)
"   Anesthesia Post Note  Patient: Jeremy Bennett  Procedure(s) Performed: COLONOSCOPY  Patient location during evaluation: Phase II Anesthesia Type: MAC Level of consciousness: awake Pain management: pain level controlled Vital Signs Assessment: post-procedure vital signs reviewed and stable Respiratory status: spontaneous breathing and respiratory function stable Cardiovascular status: blood pressure returned to baseline and stable Postop Assessment: no headache and no apparent nausea or vomiting Anesthetic complications: no Comments: Late entry   No notable events documented.   Last Vitals:  Vitals:   01/06/24 1225 01/06/24 1226  BP: (!) 77/55 (!) 127/52  Pulse:    Resp:    Temp:    SpO2:      Last Pain:  Vitals:   01/06/24 1210  TempSrc: Oral  PainSc: Asleep                 Jeremy Bennett      "

## 2024-01-17 NOTE — Progress Notes (Signed)
 7 yr TCS noted in recall Patient result letter mailed Patient's PCP is on EPIC

## 2024-09-14 ENCOUNTER — Encounter: Payer: Self-pay | Admitting: Family Medicine

## 2024-09-25 ENCOUNTER — Ambulatory Visit: Payer: Self-pay
